# Patient Record
Sex: Female | Born: 1990 | Race: Black or African American | Hispanic: No | Marital: Single | Smoking: Former smoker
Health system: Southern US, Community
[De-identification: ages and names within clinical notes are randomized; demographics above are authoritative.]

## PROBLEM LIST (undated history)

## (undated) ENCOUNTER — Inpatient Hospital Stay (HOSPITAL_COMMUNITY): Payer: Self-pay

## (undated) DIAGNOSIS — F419 Anxiety disorder, unspecified: Secondary | ICD-10-CM

## (undated) DIAGNOSIS — F410 Panic disorder [episodic paroxysmal anxiety] without agoraphobia: Secondary | ICD-10-CM

## (undated) DIAGNOSIS — Z789 Other specified health status: Secondary | ICD-10-CM

## (undated) DIAGNOSIS — D573 Sickle-cell trait: Secondary | ICD-10-CM

## (undated) HISTORY — PX: NO PAST SURGERIES: SHX2092

---
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
## 2013-12-11 ENCOUNTER — Encounter (HOSPITAL_COMMUNITY): Payer: Self-pay | Admitting: Emergency Medicine

## 2013-12-11 ENCOUNTER — Emergency Department (HOSPITAL_COMMUNITY): Payer: Self-pay

## 2013-12-11 ENCOUNTER — Emergency Department (HOSPITAL_COMMUNITY)
Admission: EM | Admit: 2013-12-11 | Discharge: 2013-12-11 | Disposition: A | Discharge status: Home / Self Care | Payer: Self-pay | Attending: Emergency Medicine | Admitting: Emergency Medicine

## 2013-12-11 DIAGNOSIS — S6980XA Other specified injuries of unspecified wrist, hand and finger(s), initial encounter: Secondary | ICD-10-CM | POA: Insufficient documentation

## 2013-12-11 DIAGNOSIS — F17200 Nicotine dependence, unspecified, uncomplicated: Secondary | ICD-10-CM | POA: Insufficient documentation

## 2013-12-11 DIAGNOSIS — S6990XA Unspecified injury of unspecified wrist, hand and finger(s), initial encounter: Secondary | ICD-10-CM | POA: Insufficient documentation

## 2013-12-11 DIAGNOSIS — S60229A Contusion of unspecified hand, initial encounter: Secondary | ICD-10-CM | POA: Insufficient documentation

## 2013-12-11 DIAGNOSIS — S60221A Contusion of right hand, initial encounter: Secondary | ICD-10-CM

## 2013-12-11 DIAGNOSIS — Z880 Allergy status to penicillin: Secondary | ICD-10-CM | POA: Insufficient documentation

## 2013-12-11 DIAGNOSIS — IMO0002 Reserved for concepts with insufficient information to code with codable children: Secondary | ICD-10-CM | POA: Insufficient documentation

## 2013-12-11 DIAGNOSIS — Y9289 Other specified places as the place of occurrence of the external cause: Secondary | ICD-10-CM | POA: Insufficient documentation

## 2013-12-11 DIAGNOSIS — S6391XA Sprain of unspecified part of right wrist and hand, initial encounter: Secondary | ICD-10-CM

## 2013-12-11 DIAGNOSIS — S6390XA Sprain of unspecified part of unspecified wrist and hand, initial encounter: Secondary | ICD-10-CM | POA: Insufficient documentation

## 2013-12-11 DIAGNOSIS — Y9389 Activity, other specified: Secondary | ICD-10-CM | POA: Insufficient documentation

## 2013-12-11 MED ORDER — HYDROCODONE-ACETAMINOPHEN 5-325 MG PO TABS
1.0000 | ORAL_TABLET | Freq: Once | ORAL | Status: AC
  Administered 2013-12-11: 1 via ORAL
  Filled 2013-12-11: qty 1

## 2013-12-11 MED ORDER — NAPROXEN 500 MG PO TABS: 500.0000 mg | ORAL_TABLET | Freq: Two times a day (BID) | ORAL | Status: DC
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
## 2013-12-11 NOTE — Discharge Instructions (Signed)
Your x-rays do not show any broken bones in your hand. Use rest, ice, compression and elevation to reduce pain. Followup with a primary care provider.   Hand Contusion A hand contusion is a deep bruise on your hand area. Contusions are the result of an injury that caused bleeding under the skin. The contusion may turn blue, purple, or yellow. Minor injuries will give you a painless contusion, but more severe contusions may stay painful and swollen for a few weeks. CAUSES  A contusion is usually caused by a blow, trauma, or direct force to an area of the body. SYMPTOMS   Swelling and redness of the injured area.  Discoloration of the injured area.  Tenderness and soreness of the injured area.  Pain. DIAGNOSIS  The diagnosis can be made by taking a history and performing a physical exam. An X-ray, CT scan, or MRI may be needed to determine if there were any associated injuries, such as broken bones (fractures). TREATMENT  Often, the best treatment for a hand contusion is resting, elevating, icing, and applying cold compresses to the injured area. Over-the-counter medicines may also be recommended for pain control. HOME CARE INSTRUCTIONS   Put ice on the injured area.  Put ice in a plastic bag.  Place a towel between your skin and the bag.  Leave the ice on for 15-20 minutes, 03-04 times a day.  Only take over-the-counter or prescription medicines as directed by your caregiver. Your caregiver may recommend avoiding anti-inflammatory medicines (aspirin, ibuprofen, and naproxen) for 48 hours because these medicines may increase bruising.  If told, use an elastic wrap as directed. This can help reduce swelling. You may remove the wrap for sleeping, showering, and bathing. If your fingers become numb, cold, or blue, take the wrap off and reapply it more loosely.  Elevate your hand with pillows to reduce swelling.  Avoid overusing your hand if it is painful. SEEK IMMEDIATE MEDICAL CARE  IF:   You have increased redness, swelling, or pain in your hand.  Your swelling or pain is not relieved with medicines.  You have loss of feeling in your hand or are unable to move your fingers.  Your hand turns cold or blue.  You have pain when you move your fingers.  Your hand becomes warm to the touch.  Your contusion does not improve in 2 days. MAKE SURE YOU:   Understand these instructions.  Will watch your condition.  Will get help right away if you are not doing well or get worse. Document Released: 09/21/2001 Document Revised: 12/25/2011 Document Reviewed: 09/23/2011 [facility] Patient Information 2015 [place], [place]. This information is not intended to replace advice given to you by your health care provider. Make sure you discuss any questions you have with your health care provider.
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
## 2013-12-13 NOTE — ED Provider Notes (Signed)
Medical screening examination/treatment/procedure(s) were performed by non-physician practitioner and as supervising physician I was immediately available for consultation/collaboration.     Aneli Zara E Sandon Yoho, MD 12/13/13 0705 

## 2014-04-15 NOTE — L&D Delivery Note (Cosign Needed)
Renold Dononisha L Althouse is a 24 y.o. female G1 @ 37.3 days presenting for SROM at 0630 this morning. Augmented with Pitocin. Patient was complete and pushed.   Delivery Note At 10:58 PM a viable female was delivered via Vaginal, Spontaneous Delivery (Presentation: Left Occiput Anterior).  APGAR: 9, 9; weight 5 lb 5.6 oz (2427 g).   Placenta status: Intact, Spontaneous.  Cord: 3 vessels with the following complications: None.  Cord pH: Not collected  Anesthesia: Epidural  Episiotomy: None Lacerations: None Suture Repair: None Est. Blood Loss (mL): 50  Mom to postpartum.  Baby to Couplet care / Skin to Skin.  Jamiel Goncalves Z Limuel Nieblas 04/11/2015, 6:14 AM

## 2014-10-06 ENCOUNTER — Encounter (HOSPITAL_COMMUNITY): Payer: Self-pay | Admitting: *Deleted

## 2014-10-06 ENCOUNTER — Inpatient Hospital Stay (HOSPITAL_COMMUNITY)
Admission: AD | Admit: 2014-10-06 | Discharge: 2014-10-06 | Disposition: A | Discharge status: Home / Self Care | Payer: Medicaid Other | Source: Ambulatory Visit | Attending: Obstetrics & Gynecology | Admitting: Obstetrics & Gynecology

## 2014-10-06 DIAGNOSIS — O2341 Unspecified infection of urinary tract in pregnancy, first trimester: Secondary | ICD-10-CM | POA: Insufficient documentation

## 2014-10-06 DIAGNOSIS — Z87891 Personal history of nicotine dependence: Secondary | ICD-10-CM | POA: Insufficient documentation

## 2014-10-06 DIAGNOSIS — O210 Mild hyperemesis gravidarum: Secondary | ICD-10-CM | POA: Diagnosis not present

## 2014-10-06 DIAGNOSIS — O209 Hemorrhage in early pregnancy, unspecified: Secondary | ICD-10-CM | POA: Diagnosis present

## 2014-10-06 DIAGNOSIS — O219 Vomiting of pregnancy, unspecified: Secondary | ICD-10-CM

## 2014-10-06 DIAGNOSIS — Z3A12 12 weeks gestation of pregnancy: Secondary | ICD-10-CM | POA: Insufficient documentation

## 2014-10-06 HISTORY — DX: Other specified health status: Z78.9

## 2014-10-06 LAB — WET PREP, GENITAL
Clue Cells Wet Prep HPF POC: NONE SEEN
Trich, Wet Prep: NONE SEEN
Yeast Wet Prep HPF POC: NONE SEEN

## 2014-10-06 LAB — URINALYSIS, ROUTINE W REFLEX MICROSCOPIC
BILIRUBIN URINE: NEGATIVE
Glucose, UA: NEGATIVE mg/dL
HGB URINE DIPSTICK: NEGATIVE
Ketones, ur: NEGATIVE mg/dL
Nitrite: NEGATIVE
PH: 6 (ref 5.0–8.0)
Protein, ur: 30 mg/dL — AB
SPECIFIC GRAVITY, URINE: 1.03 (ref 1.005–1.030)
UROBILINOGEN UA: 0.2 mg/dL (ref 0.0–1.0)

## 2014-10-06 LAB — URINE MICROSCOPIC-ADD ON

## 2014-10-06 LAB — POCT PREGNANCY, URINE: PREG TEST UR: POSITIVE — AB

## 2014-10-06 MED ORDER — METOCLOPRAMIDE HCL 10 MG PO TABS: 10.0000 mg | ORAL_TABLET | Freq: Three times a day (TID) | ORAL | Status: DC | PRN

## 2014-10-06 MED ORDER — NITROFURANTOIN MONOHYD MACRO 100 MG PO CAPS: 100.0000 mg | ORAL_CAPSULE | Freq: Two times a day (BID) | ORAL | Status: DC

## 2014-10-06 NOTE — MAU Provider Note (Signed)
History     CSN: [id number]  Arrival date and time: 10/06/14 1156   First Provider Initiated Contact with Patient 10/06/14 1333      Chief Complaint  Patient presents with  . Vaginal Bleeding   HPI Audrey [name] 24 y.o. G1P0 @[redacted]w[redacted]d  presents to MAU complaining of vaginal bleeding and abdominal pain since yesterday.  She was stressed yesterday and believes that led to abd pain.  It is 6/10 now and was 10/10 last night.  It is a sharp, cramping pain.  Has had nausea and vomiting as well as weakness.  Vaginal bleeding started today.  No pad.  She notes it upon wiping.  It is light red.  She feels empty, weak and sore.  She went to pregnancy care center 2 weeks ago and had u/s showing fetal heart beat.  She has NOP appt with Dr. Gaynell Face on 7/13.  Denies vaginal discharge, dysuria, fever.   OB History    Gravida Para Term Preterm AB TAB SAB Ectopic Multiple Living   1               Past Medical History  Diagnosis Date  . Medical history non-contributory     Past Surgical History  Procedure Laterality Date  . No past surgeries      History reviewed. No pertinent family history.  History  Substance Use Topics  . Smoking status: Former Smoker    Types: Cigarettes    Quit date: 08/06/2014  . Smokeless tobacco: Not on file  . Alcohol Use: Yes     Comment: social    Allergies:  Allergies  Allergen Reactions  . Penicillins Rash    Prescriptions prior to admission  Medication Sig Dispense Refill Last Dose  . naproxen (NAPROSYN) 500 MG tablet Take 1 tablet (500 mg total) by mouth 2 (two) times daily. 30 tablet 0     ROS Pertinent ROS in HPI.  All other systems are negative.   Physical Exam   Blood pressure 105/54, pulse 111, temperature 98.4 F (36.9 C), temperature source Oral, resp. rate 18, height 5\' 2"  (1.575 m), weight 123 lb 3.2 oz (55.883 kg), last menstrual period 07/08/2014.  Physical Exam  Constitutional: She is oriented to person, place, and time. She  appears well-developed and well-nourished. No distress.  HENT:  Head: Normocephalic and atraumatic.  Eyes: EOM are normal.  Neck: Normal range of motion.  Cardiovascular: Normal rate.   92 bpm on exam  Respiratory: Effort normal and breath sounds normal. No respiratory distress.  GI: Soft. Bowel sounds are normal. She exhibits no distension and no mass. There is no tenderness. There is no rebound and no guarding.  Genitourinary:  Thin, tan colored discharge No active bleeding from cervix No CMT, no adnexal mass or tenderness  Musculoskeletal: Normal range of motion.  Neurological: She is alert and oriented to person, place, and time.  Skin: Skin is warm and dry.  Psychiatric: She has a normal mood and affect.    MAU Course  Procedures  MDM Fetal well being assured with fetal heart tones seen on triage.  UTI based on large leuks in u/a No other signs of infection  Assessment and Plan  A:  1. Urinary tract infection during pregnancy in first trimester   2. Nausea/vomiting in pregnancy    P: Discharge to home Increase po hydration Macrobid x 1 week Urine cx pending PNC asap Daily PNV Reglan 10mg  po prn up to tid for nausea Patient may return  to MAU as needed or if her condition were to change or worsen   Bertram Denver 10/06/2014, 1:34 PM

## 2014-10-06 NOTE — Discharge Instructions (Signed)

## 2014-10-06 NOTE — MAU Note (Addendum)
Pt reports last night she started having abd pian and started having some vaginal bleeding this morning. Vomited afes times since last night as well.

## 2014-10-07 ENCOUNTER — Inpatient Hospital Stay (HOSPITAL_COMMUNITY)
Admission: AD | Admit: 2014-10-07 | Discharge: 2014-10-07 | Disposition: A | Discharge status: Home / Self Care | Payer: Medicaid Other | Source: Ambulatory Visit | Attending: Obstetrics & Gynecology | Admitting: Obstetrics & Gynecology

## 2014-10-07 ENCOUNTER — Encounter (HOSPITAL_COMMUNITY): Payer: Self-pay | Admitting: *Deleted

## 2014-10-07 DIAGNOSIS — O209 Hemorrhage in early pregnancy, unspecified: Secondary | ICD-10-CM | POA: Insufficient documentation

## 2014-10-07 DIAGNOSIS — Z3A13 13 weeks gestation of pregnancy: Secondary | ICD-10-CM | POA: Diagnosis not present

## 2014-10-07 DIAGNOSIS — Z87891 Personal history of nicotine dependence: Secondary | ICD-10-CM | POA: Diagnosis not present

## 2014-10-07 DIAGNOSIS — O4692 Antepartum hemorrhage, unspecified, second trimester: Secondary | ICD-10-CM

## 2014-10-07 LAB — CBC
HEMATOCRIT: 32.9 % — AB (ref 36.0–46.0)
HEMOGLOBIN: 12 g/dL (ref 12.0–15.0)
MCH: 30.1 pg (ref 26.0–34.0)
MCHC: 36.5 g/dL — ABNORMAL HIGH (ref 30.0–36.0)
MCV: 82.5 fL (ref 78.0–100.0)
Platelets: 257 10*3/uL (ref 150–400)
RBC: 3.99 MIL/uL (ref 3.87–5.11)
RDW: 12.4 % (ref 11.5–15.5)
WBC: 8.3 10*3/uL (ref 4.0–10.5)

## 2014-10-07 LAB — ABO/RH: ABO/RH(D): O POS

## 2014-10-07 NOTE — Discharge Instructions (Signed)
Vaginal Bleeding During Pregnancy, Second Trimester °A small amount of bleeding (spotting) from the vagina is relatively common in pregnancy. It usually stops on its own. Various things can cause bleeding or spotting in pregnancy. Some bleeding may be related to the pregnancy, and some may not. Sometimes the bleeding is normal and is not a problem. However, bleeding can also be a sign of something serious. Be sure to tell your health care provider about any vaginal bleeding right away. °Some possible causes of vaginal bleeding during the second trimester include: °· Infection, inflammation, or growths on the cervix.   °· The placenta may be partially or completely covering the opening of the cervix inside the uterus (placenta previa). °· The placenta may have separated from the uterus (abruption of the placenta).   °· You may be having early (preterm) labor.   °· The cervix may not be strong enough to keep a baby inside the uterus (cervical insufficiency).   °· Tiny cysts may have developed in the uterus instead of pregnancy tissue (molar pregnancy).  °HOME CARE INSTRUCTIONS  °Watch your condition for any changes. The following actions may help to lessen any discomfort you are feeling: °· Follow your health care provider's instructions for limiting your activity. If your health care provider orders bed rest, you may need to stay in bed and only get up to use the bathroom. However, your health care provider may allow you to continue light activity. °· If needed, make plans for someone to help with your regular activities and responsibilities while you are on bed rest. °· Keep track of the number of pads you use each day, how often you change pads, and how soaked (saturated) they are. Write this down. °· Do not use tampons. Do not douche. °· Do not have sexual intercourse or orgasms until approved by your health care provider. °· If you pass any tissue from your vagina, save the tissue so you can show it to your  health care provider. °· Only take over-the-counter or prescription medicines as directed by your health care provider. °· Do not take aspirin because it can make you bleed. °· Do not exercise or perform any strenuous activities or heavy lifting without your health care provider's permission. °· Keep all follow-up appointments as directed by your health care provider. °SEEK MEDICAL CARE IF: °· You have any vaginal bleeding during any part of your pregnancy. °· You have cramps or labor pains. °· You have a fever, not controlled by medicine. °SEEK IMMEDIATE MEDICAL CARE IF:  °· You have severe cramps in your back or belly (abdomen). °· You have contractions. °· You have chills. °· You pass large clots or tissue from your vagina. °· Your bleeding increases. °· You feel light-headed or weak, or you have fainting episodes. °· You are leaking fluid or have a gush of fluid from your vagina. °MAKE SURE YOU: °· Understand these instructions. °· Will watch your condition. °· Will get help right away if you are not doing well or get worse. °Document Released: 01/09/2005 Document Revised: 04/06/2013 Document Reviewed: 12/07/2012 °[employer]® Patient Information ©2015 [employer]. This information is not intended to replace advice given to you by your health care provider. Make sure you discuss any questions you have with your health care provider. ° °Pelvic Rest °Pelvic rest is sometimes recommended for women when:  °· The placenta is partially or completely covering the opening of the cervix (placenta previa). °· There is bleeding between the uterine wall and the amniotic sac in the   first trimester (subchorionic hemorrhage). °· The cervix begins to open without labor starting (incompetent cervix, cervical insufficiency). °· The labor is too early (preterm labor). °HOME CARE INSTRUCTIONS °· Do not have sexual intercourse, stimulation, or an orgasm. °· Do not use tampons, douche, or put anything in the vagina. °· Do not lift  anything over 10 pounds (4.5 kg). °· Avoid strenuous activity or straining your pelvic muscles. °SEEK MEDICAL CARE IF:  °· You have any vaginal bleeding during pregnancy. Treat this as a potential emergency. °· You have cramping pain felt low in the stomach (stronger than menstrual cramps). °· You notice vaginal discharge (watery, mucus, or bloody). °· You have a low, dull backache. °· There are regular contractions or uterine tightening. °SEEK IMMEDIATE MEDICAL CARE IF: °You have vaginal bleeding and have placenta previa.  °Document Released: 07/27/2010 Document Revised: 06/24/2011 Document Reviewed: 07/27/2010 °[employer]® Patient Information ©2015 [employer]. This information is not intended to replace advice given to you by your health care provider. Make sure you discuss any questions you have with your health care provider. ° °

## 2014-10-07 NOTE — MAU Provider Note (Signed)
History     CSN: [id number]  Arrival date and time: 10/07/14 2148   None     Chief Complaint  Patient presents with  . Vaginal Bleeding   This is a 24 y.o. female at [redacted]w[redacted]d who presents with c/o vaginal bleeding after intercourse tonight.  She saw blood on tissue but it has not required a pad  Denies pain or cramping. She was seen yesterday for bleeding also. States was not told to use pelvic rest. They did get FHR last night. Denies any other somatic symtoms.      Vaginal Bleeding The patient's primary symptoms include vaginal bleeding. The patient's pertinent negatives include no genital itching, genital odor or pelvic pain. This is a recurrent problem. The current episode started today. The problem occurs intermittently. The patient is experiencing no pain. She is pregnant. Pertinent negatives include no abdominal pain, chills, dysuria, fever, nausea or vomiting. The vaginal discharge was bloody. The vaginal bleeding is spotting.   RN Note: Had IC few hours ago. Went to [facility] and saw blood on tissue several times when wiped. No abd pain. Was seen her Thurs with vag bleeding       Note from visit on 10/06/14: Vaginal bleeding started today. No pad. She notes it upon wiping. It is light red. She feels empty, weak and sore. She went to pregnancy care center 2 weeks ago and had u/s showing fetal heart beat. She has NOP appt with Dr. Gaynell Face on 7/13. (Treated for UTI and nausea.  No bleeding seen on exam)  OB History    Gravida Para Term Preterm AB TAB SAB Ectopic Multiple Living   1               Past Medical History  Diagnosis Date  . Medical history non-contributory     Past Surgical History  Procedure Laterality Date  . No past surgeries      History reviewed. No pertinent family history.  History  Substance Use Topics  . Smoking status: Former Smoker    Types: Cigarettes    Quit date: 08/06/2014  . Smokeless tobacco: Not on file  . Alcohol Use: Yes   Comment: social    Allergies:  Allergies  Allergen Reactions  . Penicillins Rash    Prescriptions prior to admission  Medication Sig Dispense Refill Last Dose  . metoCLOPramide (REGLAN) 10 MG tablet Take 1 tablet (10 mg total) by mouth 3 (three) times daily as needed for nausea. 20 tablet 0 10/07/2014 at 1500  . nitrofurantoin, macrocrystal-monohydrate, (MACROBID) 100 MG capsule Take 1 capsule (100 mg total) by mouth 2 (two) times daily. 14 capsule 0 10/07/2014 at 1000    Review of Systems  Constitutional: Negative for fever, chills and malaise/fatigue.  Gastrointestinal: Negative for nausea, vomiting and abdominal pain.  Genitourinary: Positive for vaginal bleeding. Negative for dysuria and pelvic pain.  Neurological: Negative for dizziness.  Patient states she has no other complaints.  Physical Exam   Blood pressure 120/70, pulse 81, temperature 98.1 F (36.7 C), resp. rate 18, height 5\' 2"  (1.575 m), weight 122 lb 12.8 oz (55.702 kg), last menstrual period 07/08/2014.  Physical Exam  Constitutional: She is oriented to person, place, and time. She appears well-developed and well-nourished. No distress.  HENT:  Head: Normocephalic.  Neck: Normal range of motion. Neck supple.  Cardiovascular: Normal rate and regular rhythm.   Respiratory: Effort normal. No respiratory distress.  GI: Soft. She exhibits no distension. There is no tenderness. There is  no rebound and no guarding.  Genitourinary: Vaginal discharge (small amount of blood in vault, watery, c/w semen mixed with blood) found.  Cervix long and closed   Musculoskeletal: Normal range of motion. She exhibits no edema.  Neurological: She is alert and oriented to person, place, and time.  Skin: Skin is warm and dry.  Psychiatric: She has a normal mood and affect.   Small amount of watery burgundy fluid Cervix long and closed  Fetal heart rate 160s  MAU Course  Procedures  MDM Discussed bleeding in the early second  trimester. Source could be from placenta but is more likely due to cervical irritation from intercourse. FHR audible which is reassuring. Recommend pelvic rest and observation  Assessment and Plan  A:  Vaginal bleeding in the second trimester       Probable post-coital bleeding       Reassuring fetal heart beat        P:  Discussed findings and implications as above       Recommend pelvic rest until 2 weeks with no further bleeding       Pelvic cramping precautions       Discharge home        Recommend she start prenatal care soon so they can reevaluate bleeding.   Wynelle Bourgeois 10/07/2014, 10:28 PM   Not charged yet

## 2014-10-07 NOTE — MAU Note (Signed)
Had IC few hours ago. Went to [facility] and saw blood on tissue several times when wiped. No abd pain. Was seen her Thurs with vag bleeding

## 2014-10-07 NOTE — Progress Notes (Signed)
Marie Williams CNM in earlier to discuss d/c plan. Written and verbal d/c instructions given and understanding voiced. 

## 2014-10-10 LAB — CULTURE, OB URINE: Culture: >=100000

## 2014-10-20 LAB — OB RESULTS CONSOLE HEPATITIS B SURFACE ANTIGEN: HEP B S AG: NEGATIVE

## 2014-10-20 LAB — OB RESULTS CONSOLE ANTIBODY SCREEN: Antibody Screen: NEGATIVE

## 2014-10-20 LAB — OB RESULTS CONSOLE HIV ANTIBODY (ROUTINE TESTING): HIV: NONREACTIVE

## 2014-10-20 LAB — OB RESULTS CONSOLE RPR: RPR: NONREACTIVE

## 2014-10-20 LAB — OB RESULTS CONSOLE RUBELLA ANTIBODY, IGM: RUBELLA: IMMUNE

## 2014-10-20 LAB — OB RESULTS CONSOLE GC/CHLAMYDIA
CHLAMYDIA, DNA PROBE: NEGATIVE
GC PROBE AMP, GENITAL: NEGATIVE

## 2014-10-21 ENCOUNTER — Other Ambulatory Visit (HOSPITAL_COMMUNITY): Payer: Self-pay | Admitting: Nurse Practitioner

## 2014-10-21 DIAGNOSIS — Z3A19 19 weeks gestation of pregnancy: Secondary | ICD-10-CM

## 2014-10-21 DIAGNOSIS — Z3689 Encounter for other specified antenatal screening: Secondary | ICD-10-CM

## 2014-12-06 ENCOUNTER — Ambulatory Visit (HOSPITAL_COMMUNITY)
Admission: RE | Admit: 2014-12-06 | Discharge: 2014-12-06 | Disposition: A | Discharge status: Home / Self Care | Payer: Medicaid Other | Source: Ambulatory Visit | Attending: Nurse Practitioner | Admitting: Nurse Practitioner

## 2014-12-06 DIAGNOSIS — Z36 Encounter for antenatal screening of mother: Secondary | ICD-10-CM | POA: Diagnosis present

## 2014-12-06 DIAGNOSIS — Z3689 Encounter for other specified antenatal screening: Secondary | ICD-10-CM

## 2014-12-06 DIAGNOSIS — Z3A19 19 weeks gestation of pregnancy: Secondary | ICD-10-CM | POA: Diagnosis not present

## 2014-12-12 ENCOUNTER — Other Ambulatory Visit (HOSPITAL_COMMUNITY): Payer: Self-pay | Admitting: Nurse Practitioner

## 2014-12-12 DIAGNOSIS — Z3A24 24 weeks gestation of pregnancy: Secondary | ICD-10-CM

## 2014-12-12 DIAGNOSIS — IMO0002 Reserved for concepts with insufficient information to code with codable children: Secondary | ICD-10-CM

## 2014-12-12 DIAGNOSIS — Z0489 Encounter for examination and observation for other specified reasons: Secondary | ICD-10-CM

## 2015-01-06 ENCOUNTER — Ambulatory Visit (HOSPITAL_COMMUNITY)
Admission: RE | Admit: 2015-01-06 | Discharge: 2015-01-06 | Disposition: A | Discharge status: Home / Self Care | Payer: Medicaid Other | Source: Ambulatory Visit | Attending: Nurse Practitioner | Admitting: Nurse Practitioner

## 2015-01-06 ENCOUNTER — Ambulatory Visit (HOSPITAL_COMMUNITY): Payer: Medicaid Other

## 2015-01-06 DIAGNOSIS — Z0489 Encounter for examination and observation for other specified reasons: Secondary | ICD-10-CM

## 2015-01-06 DIAGNOSIS — IMO0002 Reserved for concepts with insufficient information to code with codable children: Secondary | ICD-10-CM

## 2015-01-06 DIAGNOSIS — Z36 Encounter for antenatal screening of mother: Secondary | ICD-10-CM | POA: Diagnosis present

## 2015-01-06 DIAGNOSIS — Z3A24 24 weeks gestation of pregnancy: Secondary | ICD-10-CM

## 2015-02-04 ENCOUNTER — Encounter (HOSPITAL_COMMUNITY): Payer: Self-pay | Admitting: *Deleted

## 2015-02-04 ENCOUNTER — Inpatient Hospital Stay (HOSPITAL_COMMUNITY)
Admission: AD | Admit: 2015-02-04 | Discharge: 2015-02-04 | Disposition: A | Discharge status: Home / Self Care | Payer: Medicaid Other | Source: Ambulatory Visit | Attending: Family Medicine | Admitting: Family Medicine

## 2015-02-04 DIAGNOSIS — Z880 Allergy status to penicillin: Secondary | ICD-10-CM | POA: Insufficient documentation

## 2015-02-04 DIAGNOSIS — N939 Abnormal uterine and vaginal bleeding, unspecified: Secondary | ICD-10-CM | POA: Diagnosis present

## 2015-02-04 DIAGNOSIS — Z3A28 28 weeks gestation of pregnancy: Secondary | ICD-10-CM | POA: Insufficient documentation

## 2015-02-04 DIAGNOSIS — O4693 Antepartum hemorrhage, unspecified, third trimester: Secondary | ICD-10-CM | POA: Diagnosis not present

## 2015-02-04 DIAGNOSIS — Z87891 Personal history of nicotine dependence: Secondary | ICD-10-CM | POA: Insufficient documentation

## 2015-02-04 HISTORY — DX: Sickle-cell trait: D57.3

## 2015-02-04 LAB — WET PREP, GENITAL
CLUE CELLS WET PREP: NONE SEEN
Trich, Wet Prep: NONE SEEN
YEAST WET PREP: NONE SEEN

## 2015-02-04 LAB — URINALYSIS, ROUTINE W REFLEX MICROSCOPIC
Bilirubin Urine: NEGATIVE
Glucose, UA: NEGATIVE mg/dL
Ketones, ur: NEGATIVE mg/dL
NITRITE: NEGATIVE
PROTEIN: NEGATIVE mg/dL
Specific Gravity, Urine: 1.01 (ref 1.005–1.030)
UROBILINOGEN UA: 2 mg/dL — AB (ref 0.0–1.0)
pH: 6.5 (ref 5.0–8.0)

## 2015-02-04 LAB — URINE MICROSCOPIC-ADD ON

## 2015-02-04 NOTE — MAU Provider Note (Signed)
History   CSN: [id number]  Arrival date and time: 02/04/15 1910   First Provider Initiated Contact with Patient 02/04/15 2024      Chief Complaint  Patient presents with  . Vaginal Bleeding   HPI  Patient is a 24 yo G1P0 who presents for vaginal bleeding since this afternoon. It began about 45 minutes after vaginal intercourse with her fiance. At that time, the patient noticed bright red blood when she wiped with urination. It kept bleeding with wiping. This decreased to pinkish spotting with wiping over the course of the evening. At present, she denies bleeding. She states intercourse was more vigorous than usual today and that this was the only difference from normal activities. She denies cramping, feeling any contractions, n/v/d, and dysuria. She feels her baby moving more frequently than every 3 hours, states he moves "all the time."  She had vaginal bleeding earlier in pregnancy at about 3 months and was told to avoid rough sex. Otherwise, she has had an uncomplicated pregnancy and receives care through the health department.  Reviewing her ultrasound from 01/06/15 at [redacted]w[redacted]d, her placenta was posterior with no previa.   OB History    Gravida Para Term Preterm AB TAB SAB Ectopic Multiple Living   1               Past Medical History  Diagnosis Date  . Medical history non-contributory   . Sickle cell trait [facility])     Past Surgical History  Procedure Laterality Date  . No past surgeries      History reviewed. No pertinent family history.  Social History  Substance Use Topics  . Smoking status: Former Smoker    Types: Cigarettes    Quit date: 08/06/2014  . Smokeless tobacco: None  . Alcohol Use: No     Comment: social    Allergies:  Allergies  Allergen Reactions  . Penicillins Other (See Comments)    Reaction:  Unknown, childhood reaction     Prescriptions prior to admission  Medication Sig Dispense Refill Last Dose  . Prenatal Vit-Fe Fumarate-FA  (MULTIVITAMIN-PRENATAL) 27-0.8 MG TABS tablet Take 1 tablet by mouth daily.    02/04/2015 at Unknown time  . metoCLOPramide (REGLAN) 10 MG tablet Take 1 tablet (10 mg total) by mouth 3 (three) times daily as needed for nausea. (Patient not taking: Reported on 02/04/2015) 20 tablet 0 10/07/2014 at 1500  . nitrofurantoin, macrocrystal-monohydrate, (MACROBID) 100 MG capsule Take 1 capsule (100 mg total) by mouth 2 (two) times daily. (Patient not taking: Reported on 02/04/2015) 14 capsule 0 10/07/2014 at 1000    Review of Systems  Constitutional: Negative for fever and chills.  Cardiovascular: Negative for chest pain.  Gastrointestinal: Negative for nausea, vomiting and abdominal pain.  Genitourinary: Negative for dysuria, urgency and frequency.  Neurological: Negative for headaches.   Physical Exam   Blood pressure 118/52, pulse 88, temperature 98.1 F (36.7 C), resp. rate 18, height  (1.6 m), weight 67.677 kg (149 lb 3.2 oz), last menstrual period 07/08/2014.  Physical Exam  Constitutional: She is oriented to person, place, and time. She appears well-developed and well-nourished. No distress.  HENT:  Head: Normocephalic and atraumatic.  Cardiovascular: Normal rate, regular rhythm and normal heart sounds.  Exam reveals no gallop and no friction rub.   No murmur heard. Respiratory: Effort normal and breath sounds normal. No respiratory distress.  GI: She exhibits mass (gravid). There is no tenderness.  Musculoskeletal: Normal range of motion.  Neurological: She  is alert and oriented to person, place, and time.  Skin: Skin is warm and dry.  Psychiatric: She has a normal mood and affect. Her behavior is normal.  GU: Scant brown discharge at cervical os. Scant white discharge. No lesions observed. Cervix closed on exam. No cervical motion tenderness.   MAU Course  Procedures  MDM  FHT reactive, toco negative for contractions.  Speculum exam performed to assess for continued  bleeding.   UA, wet prep, and gc/chlamydia labs obtained to assess for infection.  Assessment and Plan  Patient presents at 5472w1d with concern for vaginal bleeding after vaginal intercourse this afternoon. FHT was reactive, patient not feeling cramping or contractions, toco negative for contractions and no BRB at cervix on speculum exam. Suspect bleeding, which has improved, was due to cervical irritation after intercourse. UA and wet prep were negative for infection. GC/chlamydia and urine culture pending. Given posterior placenta seen on prior U/S and exam negative for abdominal pain, placenta previa and abruption both very unlikely.   -Discharge patient home. -Provided preterm labor precautions and return precautions. -Advised less vigorous intercourse.  - f/u gc/chlamydia and urine culture  Jamelle HaringHillary M Fitzgerald, MD [facility], PGY-1 02/04/2015, 8:24 PM    OB FELLOW MAU DISCHARGE ATTESTATION  I have seen and examined this patient; I agree with above documentation in the resident's note.    Silvano BilisNoah B Shanay Woolman, MD 1:25 AM

## 2015-02-04 NOTE — MAU Note (Signed)
Pt. States she had intercourse this evening and experienced bright red bleeding after. Now having small amount of pink bleeding. No clots noted. Baby is moving well. Pt. States she did have bleeding earlier in pregnancy around 3 months and was told no rough sex as a precaution. Pt. States [facility]. Denies LOF or other discharge noted.

## 2015-02-04 NOTE — Discharge Instructions (Signed)
Ms. Audrey [name], your vaginal bleeding appears to be from irritation after sexual intercourse. The labs we got back were negative for a urinary tract infection and bacterial vaginosis, trichomoniasis and yeast infections. Sometimes these can present with vaginal bleeding. The fact that you are not having abdominal pain, cramping, contractions and that the amount of bleeding has decreased are all reassuring signs. If any of the remaining labs testing for gonorrhea and chlamydia come back abnormal, we will let you know. Thank you!

## 2015-02-04 NOTE — MAU Note (Addendum)
Vag bleeding after intercourse about . No pain. No clots. Still having some pink d/c but bright red initially.

## 2015-02-06 LAB — GC/CHLAMYDIA PROBE AMP (~~LOC~~) NOT AT ARMC
CHLAMYDIA, DNA PROBE: NEGATIVE
Neisseria Gonorrhea: NEGATIVE

## 2015-02-06 LAB — CULTURE, OB URINE: Special Requests: NORMAL

## 2015-04-10 ENCOUNTER — Inpatient Hospital Stay (HOSPITAL_COMMUNITY): Payer: Medicaid Other | Admitting: Anesthesiology

## 2015-04-10 ENCOUNTER — Inpatient Hospital Stay (HOSPITAL_COMMUNITY)
Admission: AD | Admit: 2015-04-10 | Discharge: 2015-04-12 | DRG: 775 | Disposition: A | Discharge status: Home / Self Care | Payer: Medicaid Other | Source: Ambulatory Visit | Attending: Obstetrics and Gynecology | Admitting: Obstetrics and Gynecology

## 2015-04-10 ENCOUNTER — Encounter (HOSPITAL_COMMUNITY): Payer: Self-pay

## 2015-04-10 DIAGNOSIS — Z3A37 37 weeks gestation of pregnancy: Secondary | ICD-10-CM

## 2015-04-10 DIAGNOSIS — Z87891 Personal history of nicotine dependence: Secondary | ICD-10-CM

## 2015-04-10 DIAGNOSIS — Z6832 Body mass index (BMI) 32.0-32.9, adult: Secondary | ICD-10-CM

## 2015-04-10 DIAGNOSIS — E669 Obesity, unspecified: Secondary | ICD-10-CM | POA: Diagnosis present

## 2015-04-10 DIAGNOSIS — O99214 Obesity complicating childbirth: Principal | ICD-10-CM | POA: Diagnosis present

## 2015-04-10 LAB — AMNISURE RUPTURE OF MEMBRANE (ROM) NOT AT ARMC: Amnisure ROM: POSITIVE

## 2015-04-10 LAB — CBC
HCT: 37.8 % (ref 36.0–46.0)
Hemoglobin: 12.8 g/dL (ref 12.0–15.0)
MCH: 29.6 pg (ref 26.0–34.0)
MCHC: 33.9 g/dL (ref 30.0–36.0)
MCV: 87.3 fL (ref 78.0–100.0)
PLATELETS: 225 10*3/uL (ref 150–400)
RBC: 4.33 MIL/uL (ref 3.87–5.11)
RDW: 13.6 % (ref 11.5–15.5)
WBC: 6.9 10*3/uL (ref 4.0–10.5)

## 2015-04-10 LAB — POCT FERN TEST: POCT Fern Test: NEGATIVE

## 2015-04-10 LAB — TYPE AND SCREEN
ABO/RH(D): O POS
Antibody Screen: NEGATIVE

## 2015-04-10 LAB — GROUP B STREP BY PCR: GROUP B STREP BY PCR: NEGATIVE

## 2015-04-10 LAB — OB RESULTS CONSOLE GBS: STREP GROUP B AG: NEGATIVE

## 2015-04-10 MED ORDER — LACTATED RINGERS IV SOLN
500.0000 mL | INTRAVENOUS | Status: DC | PRN
  Administered 2015-04-10: 1000 mL via INTRAVENOUS

## 2015-04-10 MED ORDER — LACTATED RINGERS IV SOLN
INTRAVENOUS | Status: DC
  Administered 2015-04-10 (×2): via INTRAVENOUS

## 2015-04-10 MED ORDER — ACETAMINOPHEN 325 MG PO TABS: 650.0000 mg | ORAL_TABLET | ORAL | Status: DC | PRN

## 2015-04-10 MED ORDER — LIDOCAINE HCL (PF) 1 % IJ SOLN
INTRAMUSCULAR | Status: DC | PRN
  Administered 2015-04-10 (×2): 8 mL via EPIDURAL

## 2015-04-10 MED ORDER — FENTANYL CITRATE (PF) 100 MCG/2ML IJ SOLN
50.0000 ug | INTRAMUSCULAR | Status: DC | PRN
  Administered 2015-04-10 (×2): 100 ug via INTRAVENOUS
  Filled 2015-04-10 (×2): qty 2

## 2015-04-10 MED ORDER — OXYTOCIN 40 UNITS IN LACTATED RINGERS INFUSION - SIMPLE MED
1.0000 m[IU]/min | INTRAVENOUS | Status: DC
  Administered 2015-04-10: 2 m[IU]/min via INTRAVENOUS

## 2015-04-10 MED ORDER — OXYCODONE-ACETAMINOPHEN 5-325 MG PO TABS: 2.0000 | ORAL_TABLET | ORAL | Status: DC | PRN

## 2015-04-10 MED ORDER — OXYCODONE-ACETAMINOPHEN 5-325 MG PO TABS: 1.0000 | ORAL_TABLET | ORAL | Status: DC | PRN

## 2015-04-10 MED ORDER — EPHEDRINE 5 MG/ML INJ
10.0000 mg | INTRAVENOUS | Status: DC | PRN
  Filled 2015-04-10: qty 2

## 2015-04-10 MED ORDER — HYDROXYZINE HCL 50 MG PO TABS
50.0000 mg | ORAL_TABLET | Freq: Four times a day (QID) | ORAL | Status: DC | PRN
  Filled 2015-04-10: qty 1

## 2015-04-10 MED ORDER — SODIUM CHLORIDE 0.9 % IV SOLN: 250.0000 mL | INTRAVENOUS | Status: DC | PRN

## 2015-04-10 MED ORDER — FENTANYL 2.5 MCG/ML BUPIVACAINE 1/10 % EPIDURAL INFUSION (WH - ANES)
14.0000 mL/h | INTRAMUSCULAR | Status: DC | PRN
  Administered 2015-04-10: 14 mL/h via EPIDURAL
  Filled 2015-04-10: qty 125

## 2015-04-10 MED ORDER — TERBUTALINE SULFATE 1 MG/ML IJ SOLN
0.2500 mg | Freq: Once | INTRAMUSCULAR | Status: DC | PRN
  Filled 2015-04-10: qty 1

## 2015-04-10 MED ORDER — DIPHENHYDRAMINE HCL 50 MG/ML IJ SOLN: 12.5000 mg | INTRAMUSCULAR | Status: DC | PRN

## 2015-04-10 MED ORDER — OXYTOCIN 10 UNIT/ML IJ SOLN: 10.0000 [IU] | Freq: Once | INTRAMUSCULAR | Status: DC

## 2015-04-10 MED ORDER — ONDANSETRON HCL 4 MG/2ML IJ SOLN
4.0000 mg | Freq: Four times a day (QID) | INTRAMUSCULAR | Status: DC | PRN
  Administered 2015-04-10: 4 mg via INTRAVENOUS
  Filled 2015-04-10: qty 2

## 2015-04-10 MED ORDER — OXYTOCIN 40 UNITS IN LACTATED RINGERS INFUSION - SIMPLE MED
62.5000 mL/h | INTRAVENOUS | Status: DC
  Administered 2015-04-10: 62.5 mL/h via INTRAVENOUS
  Filled 2015-04-10: qty 1000

## 2015-04-10 MED ORDER — OXYTOCIN BOLUS FROM INFUSION
500.0000 mL | INTRAVENOUS | Status: DC
  Administered 2015-04-10: 500 mL via INTRAVENOUS

## 2015-04-10 MED ORDER — SODIUM CHLORIDE 0.9 % IJ SOLN: 3.0000 mL | INTRAMUSCULAR | Status: DC | PRN

## 2015-04-10 MED ORDER — PHENYLEPHRINE 40 MCG/ML (10ML) SYRINGE FOR IV PUSH (FOR BLOOD PRESSURE SUPPORT)
80.0000 ug | PREFILLED_SYRINGE | INTRAVENOUS | Status: DC | PRN
  Filled 2015-04-10: qty 2
  Filled 2015-04-10: qty 20

## 2015-04-10 MED ORDER — LIDOCAINE HCL (PF) 1 % IJ SOLN
30.0000 mL | INTRAMUSCULAR | Status: DC | PRN
  Filled 2015-04-10: qty 30

## 2015-04-10 MED ORDER — SODIUM CHLORIDE 0.9 % IJ SOLN: 3.0000 mL | Freq: Two times a day (BID) | INTRAMUSCULAR | Status: DC

## 2015-04-10 MED ORDER — CITRIC ACID-SODIUM CITRATE 334-500 MG/5ML PO SOLN: 30.0000 mL | ORAL | Status: DC | PRN

## 2015-04-10 NOTE — Anesthesia Preprocedure Evaluation (Signed)
Anesthesia Evaluation  Patient identified by MRN, date of birth, ID band Patient awake    Reviewed: Allergy & Precautions, H&P , NPO status , Patient's Chart, lab work & pertinent test results  Airway Mallampati: I  TM Distance: >3 FB Neck ROM: full    Dental no notable dental hx.    Pulmonary neg pulmonary ROS, former smoker,    Pulmonary exam normal        Cardiovascular negative cardio ROS Normal cardiovascular exam     Neuro/Psych negative neurological ROS  negative psych ROS   GI/Hepatic negative GI ROS, Neg liver ROS,   Endo/Other  negative endocrine ROS  Renal/GU negative Renal ROS     Musculoskeletal   Abdominal (+) + obese,   Peds  Hematology negative hematology ROS (+)   Anesthesia Other Findings   Reproductive/Obstetrics (+) Pregnancy                             Anesthesia Physical Anesthesia Plan  ASA: II  Anesthesia Plan: Epidural   Post-op Pain Management:    Induction:   Airway Management Planned:   Additional Equipment:   Intra-op Plan:   Post-operative Plan:   Informed Consent: I have reviewed the patients History and Physical, chart, labs and discussed the procedure including the risks, benefits and alternatives for the proposed anesthesia with the patient or authorized representative who has indicated his/her understanding and acceptance.     Plan Discussed with:   Anesthesia Plan Comments:         Anesthesia Quick Evaluation  

## 2015-04-10 NOTE — Anesthesia Procedure Notes (Signed)
Epidural Patient location during procedure: OB Start time: 04/10/2015 7:57 PM End time: 04/10/2015 8:01 PM  Staffing Anesthesiologist: Leilani AbleHATCHETT, Kaydense Rizo Performed by: anesthesiologist   Preanesthetic Checklist Completed: patient identified, surgical consent, pre-op evaluation, timeout performed, IV checked, risks and benefits discussed and monitors and equipment checked  Epidural Patient position: sitting Prep: site prepped and draped and DuraPrep Patient monitoring: continuous pulse ox and blood pressure Approach: midline Location: L3-L4 Injection technique: LOR air  Needle:  Needle type: Tuohy  Needle gauge: 17 G Needle length: 9 cm and 9 Needle insertion depth: 6 cm Catheter type: closed end flexible Catheter size: 19 Gauge Catheter at skin depth: 11 cm Test dose: negative and Other  Assessment Sensory level: T9 Events: blood not aspirated, injection not painful, no injection resistance, negative IV test and no paresthesia  Additional Notes Reason for block:procedure for pain

## 2015-04-10 NOTE — MAU Note (Signed)
Pt c/o clear fluid leaking starting around 0620 this morning. Pt feeling some irregular contractions. Pt denies bleeding.

## 2015-04-10 NOTE — Progress Notes (Signed)
Renold Dononisha L Yepes is a 24 y.o. G1P0000 at 234w3d by ultrasound admitted for rupture of membranes  Subjective:   Objective: BP 120/69 mmHg  Pulse 69  Temp(Src) 98.4 F (36.9 C) (Oral)  Resp 18  Ht 5\' 3"  (1.6 m)  Wt 180 lb (81.647 kg)  BMI 31.89 kg/m2  LMP 07/08/2014      FHT:  FHR: 135 bpm, variability: moderate,  accelerations:  Present,  decelerations:  Absent UC:   regular, every 2-4 minutes SVE:   Dilation: 5 Effacement (%): 90 Station: -2 Exam by:: [email or web address],rnc-ob  Labs: Lab Results  Component Value Date   WBC 6.9 04/10/2015   HGB 12.8 04/10/2015   HCT 37.8 04/10/2015   MCV 87.3 04/10/2015   PLT 225 04/10/2015    Assessment / Plan: Augmentation of labor, progressing well  Labor: Progressing normally Preeclampsia:  no signs or symptoms of toxicity and intake and ouput balanced Fetal Wellbeing:  Category I Pain Control:  Labor support without medications I/D:  n/a Anticipated MOD:  NSVD  Suleika Donavan DARLENE 04/10/2015, 2:21 PM

## 2015-04-10 NOTE — Progress Notes (Signed)
Labor Progress Note Audrey [name] is a 24 y.o. G1P0000 at 3735w3d presented for SROM @ 6:20 AM  S: Patient nauseated after receiving epidural  O:  BP 124/67 mmHg  Pulse 86  Temp(Src) 97.5 F (36.4 C) (Oral)  Resp 18  Ht 5\' 3"  (1.6 m)  Wt 180 lb (81.647 kg)  BMI 31.89 kg/m2  SpO2 100%  LMP 07/08/2014 EFM: 130 bpm/ Moderate variability/ Reactive   CVE: Dilation: 8 Effacement (%): 80, 90 Cervical Position: Middle Station: 0, +1 Presentation: Vertex Exam by:: Audrey BurtonEmily Rothermel RN    A&P: 24 y.o. G1P0000 4035w3d presented for SROM @ 6:20 AM  #Labor: Continue Pitocin  #Pain: Epidural  #FWB: Category 1  #GBS negative   Aliahna Statzer Angelene GiovanniZ Kassady Laboy, MD 8:50 PM

## 2015-04-10 NOTE — H&P (Signed)
Audrey [name] is a 24 y.o. female G1 @ 37.3 days presenting for SROM at 0630 this morning. Fluid was clear. Maternal Medical History:  Reason for admission: Rupture of membranes.   Contractions: Onset was less than 1 hour ago.   Frequency: rare.   Perceived severity is mild.    Fetal activity: Perceived fetal activity is normal.   Last perceived fetal movement was within the past hour.    Prenatal complications: no prenatal complications Prenatal Complications - Diabetes: none.    OB History    Gravida Para Term Preterm AB TAB SAB Ectopic Multiple Living   1 0 0 0 0 0 0 0 0 0      Past Medical History  Diagnosis Date  . Medical history non-contributory   . Sickle cell trait (HCC)   . Sickle cell trait [facility](HCC)    Past Surgical History  Procedure Laterality Date  . No past surgeries     Family History: family history is negative for Cancer, Diabetes, and Hypertension. Social History:  reports that she quit smoking about 8 months ago. Her smoking use included Cigarettes. She has never used smokeless tobacco. She reports that she does not drink alcohol or use illicit drugs.   Prenatal Transfer Tool  Maternal Diabetes: No Genetic Screening: Normal Maternal Ultrasounds/Referrals: Normal Fetal Ultrasounds or other Referrals:  None Maternal Substance Abuse:  No Significant Maternal Medications:  None Significant Maternal Lab Results:  Lab values include: Other:  Other Comments:  unknown GBS status  Review of Systems  Constitutional: Negative.   HENT: Negative.   Eyes: Negative.   Respiratory: Negative.   Cardiovascular: Negative.   Gastrointestinal: Negative.   Genitourinary: Negative.   Musculoskeletal: Negative.   Skin: Negative.   Neurological: Negative.   Endo/Heme/Allergies: Negative.   Psychiatric/Behavioral: Negative.     Dilation: 4 Effacement (%): 60 Station: -2 Exam by:: [email or web address],rnc Blood pressure 123/85, pulse 71, temperature 98.4 F (36.9 C),  temperature source Oral, resp. rate 18, height 5\' 3"  (1.6 m), weight 180 lb (81.647 kg), last menstrual period 07/08/2014. Maternal Exam:  Uterine Assessment: Contraction strength is mild.  Contraction frequency is rare.   Abdomen: Patient reports no abdominal tenderness. Fetal presentation: vertex  Introitus: Normal vulva. Amniotic fluid character: clear.  Pelvis: adequate for delivery.   Cervix: Cervix evaluated by digital exam.     Fetal Exam Fetal Monitor Review: Mode: ultrasound.   Variability: moderate (6-25 bpm).    Fetal State Assessment: Category I - tracings are normal.     Physical Exam  Constitutional: She is oriented to person, place, and time. She appears well-developed and well-nourished.  HENT:  Head: Normocephalic.  Eyes: Pupils are equal, round, and reactive to light.  Neck: Normal range of motion.  Cardiovascular: Normal rate, regular rhythm, normal heart sounds and intact distal pulses.   Respiratory: Effort normal and breath sounds normal.  GI: Soft. Bowel sounds are normal.  Genitourinary: Vagina normal and uterus normal.  Musculoskeletal: Normal range of motion.  Neurological: She is alert and oriented to person, place, and time. She has normal reflexes.  Skin: Skin is warm and dry.  Psychiatric: She has a normal mood and affect. Her behavior is normal. Judgment and thought content normal.    Prenatal labs: ABO, Rh: --/--/O POS (06/24 2205) Antibody: Negative (07/07 0000) Rubella: Immune (07/07 0000) RPR: Nonreactive (07/07 0000)  HBsAg: Negative (07/07 0000)  HIV: Non-reactive (07/07 0000)  GBS:     Assessment/Plan: SROM at 0630, mild occasional contraction,  will admit. GBS unknown.   Wyvonnia Dusky DARLENE 04/10/2015, 11:32 AM

## 2015-04-10 NOTE — Progress Notes (Signed)
Audrey [name] is a 24 y.o. G1P0000 at 1228w3d by ultrasound admitted for rupture of membranes  Subjective:   Objective: BP 97/67 mmHg  Pulse 80  Temp(Src) 98 F (36.7 C) (Axillary)  Resp 18  Ht 5\' 3"  (1.6 m)  Wt 180 lb (81.647 kg)  BMI 31.89 kg/m2  LMP 07/08/2014      FHT:  FHR: 135 bpm, variability: moderate,  accelerations:  Present,  decelerations:  Absent UC:   regular, every 5 minutes SVE:   Dilation: 5.5 Effacement (%): 90 Station: -1, 0 Exam by:: rzhang,rnc-ob  Labs: Lab Results  Component Value Date   WBC 6.9 04/10/2015   HGB 12.8 04/10/2015   HCT 37.8 04/10/2015   MCV 87.3 04/10/2015   PLT 225 04/10/2015    Assessment / Plan: Augmentation of labor, progressing well  Labor: Progressing normally Preeclampsia:  no signs or symptoms of toxicity and intake and ouput balanced Fetal Wellbeing:  Category I Pain Control:  Fentanyl I/D:  n/a Anticipated MOD:  NSVD  Audrey [name] 04/10/2015, 6:14 PM

## 2015-04-11 ENCOUNTER — Encounter (HOSPITAL_COMMUNITY): Payer: Self-pay | Admitting: *Deleted

## 2015-04-11 LAB — CBC
HCT: 32.3 % — ABNORMAL LOW (ref 36.0–46.0)
Hemoglobin: 11 g/dL — ABNORMAL LOW (ref 12.0–15.0)
MCH: 29.3 pg (ref 26.0–34.0)
MCHC: 34.1 g/dL (ref 30.0–36.0)
MCV: 86.1 fL (ref 78.0–100.0)
PLATELETS: 188 10*3/uL (ref 150–400)
RBC: 3.75 MIL/uL — ABNORMAL LOW (ref 3.87–5.11)
RDW: 13.5 % (ref 11.5–15.5)
WBC: 9.2 10*3/uL (ref 4.0–10.5)

## 2015-04-11 LAB — RPR: RPR: NONREACTIVE

## 2015-04-11 MED ORDER — BENZOCAINE-MENTHOL 20-0.5 % EX AERO
1.0000 "application " | INHALATION_SPRAY | CUTANEOUS | Status: DC | PRN
  Administered 2015-04-11: 1 via TOPICAL
  Filled 2015-04-11: qty 56

## 2015-04-11 MED ORDER — DIBUCAINE 1 % RE OINT: 1.0000 "application " | TOPICAL_OINTMENT | RECTAL | Status: DC | PRN

## 2015-04-11 MED ORDER — OXYCODONE-ACETAMINOPHEN 5-325 MG PO TABS
1.0000 | ORAL_TABLET | ORAL | Status: DC | PRN
  Administered 2015-04-11: 1 via ORAL
  Filled 2015-04-11: qty 1

## 2015-04-11 MED ORDER — IBUPROFEN 600 MG PO TABS
600.0000 mg | ORAL_TABLET | Freq: Four times a day (QID) | ORAL | Status: DC
  Administered 2015-04-11 – 2015-04-12 (×6): 600 mg via ORAL
  Filled 2015-04-11 (×6): qty 1

## 2015-04-11 MED ORDER — DIPHENHYDRAMINE HCL 25 MG PO CAPS: 25.0000 mg | ORAL_CAPSULE | Freq: Four times a day (QID) | ORAL | Status: DC | PRN

## 2015-04-11 MED ORDER — SIMETHICONE 80 MG PO CHEW: 80.0000 mg | CHEWABLE_TABLET | ORAL | Status: DC | PRN

## 2015-04-11 MED ORDER — ZOLPIDEM TARTRATE 5 MG PO TABS: 5.0000 mg | ORAL_TABLET | Freq: Every evening | ORAL | Status: DC | PRN

## 2015-04-11 MED ORDER — ACETAMINOPHEN 325 MG PO TABS
650.0000 mg | ORAL_TABLET | ORAL | Status: DC | PRN
  Administered 2015-04-11: 650 mg via ORAL
  Filled 2015-04-11: qty 2

## 2015-04-11 MED ORDER — ONDANSETRON HCL 4 MG/2ML IJ SOLN: 4.0000 mg | INTRAMUSCULAR | Status: DC | PRN

## 2015-04-11 MED ORDER — LANOLIN HYDROUS EX OINT: TOPICAL_OINTMENT | CUTANEOUS | Status: DC | PRN

## 2015-04-11 MED ORDER — ONDANSETRON HCL 4 MG PO TABS: 4.0000 mg | ORAL_TABLET | ORAL | Status: DC | PRN

## 2015-04-11 MED ORDER — PRENATAL MULTIVITAMIN CH
1.0000 | ORAL_TABLET | Freq: Every day | ORAL | Status: DC
  Administered 2015-04-11 – 2015-04-12 (×2): 1 via ORAL
  Filled 2015-04-11 (×2): qty 1

## 2015-04-11 MED ORDER — TETANUS-DIPHTH-ACELL PERTUSSIS 5-2.5-18.5 LF-MCG/0.5 IM SUSP: 0.5000 mL | Freq: Once | INTRAMUSCULAR | Status: DC

## 2015-04-11 MED ORDER — WITCH HAZEL-GLYCERIN EX PADS: 1.0000 "application " | MEDICATED_PAD | CUTANEOUS | Status: DC | PRN

## 2015-04-11 MED ORDER — SENNOSIDES-DOCUSATE SODIUM 8.6-50 MG PO TABS
2.0000 | ORAL_TABLET | ORAL | Status: DC
  Administered 2015-04-11 (×2): 2 via ORAL
  Filled 2015-04-11 (×2): qty 2

## 2015-04-11 NOTE — Lactation Note (Signed)
This note was copied from the chart of Audrey [name]. Lactation Consultation Note New mom has short shaft nipples, everts when stimulated. Rt. Nipples everts more, Lt. Nipples compresses inwards flat. Reverse pressure done to evert nipple more, but not helpful. Breast feel heavy, hand expression taught. Rt. Breast has good flow of colostrum. Lt. Breast no colostrum came out. I feel that Lt. Breast feel like it has edema. Gave mom shells and asked after pumping to wear bra for support and apply shells to evert nipple more for a deeper latch. Fitted mom #16NS and application taught.  Mom encouraged to feed baby 8-12 times/24 hours and with feeding cues. Referred to Baby and Me Book in Breastfeeding section Pg. 22-23 for position options and Proper latch demonstration. Mom shown how to use DEBP & how to disassemble, clean, & reassemble parts. Explained the importance of post pumping. Mom knows to pump q3h for 15-20 min.Mom encouraged to do skin-to-skin. Information reviewed for LPI newborn behavior and information sheet given. Since the baby is 5.5 lbs, discussed supplementing. Mom is to BF, pump, hand express then give the baby anything mom got, then give formula according to age of baby. Encouraged strict I&O,and supply and demand. WH/LC brochure given w/resources, support groups and LC services. Baby needed a lot of stimulation to wake up and to BF. Explained since the baby is SGA he may need more stimulation and more important not to miss many feedings.  Patient Name: Audrey Rebeca Allegraonisha Reeder AVWUJ'WToday's Date: 04/11/2015 Reason for consult: Initial assessment;Late preterm infant;Difficult latch;Infant < 6lbs   Maternal Data Has patient been taught Hand Expression?: Yes Does the patient have breastfeeding experience prior to this delivery?: No  Feeding Feeding Type: Other (comment) (breast, breast milk, formula in bottle.) Nipple Type: Slow - flow Length of feed: 0 min (would not wake up enough to  suck)  LATCH Score/Interventions Latch: Repeated attempts needed to sustain latch, nipple held in mouth throughout feeding, stimulation needed to elicit sucking reflex. Intervention(s): Skin to skin;Teach feeding cues;Waking techniques Intervention(s): Adjust position;Assist with latch;Breast massage;Breast compression  Audible Swallowing: A few with stimulation Intervention(s): Hand expression Intervention(s): Hand expression;Alternate breast massage  Type of Nipple: Everted at rest and after stimulation (Lt. flatens out, Rt. everts.) Intervention(s): Reverse pressure;Shells;Hand pump;Double electric pump  Comfort (Breast/Nipple): Soft / non-tender     Hold (Positioning): Assistance needed to correctly position infant at breast and maintain latch. Intervention(s): Skin to skin;Position options;Support Pillows;Breastfeeding basics reviewed  LATCH Score: 7  Lactation Tools Discussed/Used Tools: Shells;Nipple Shields;Pump;Bottle Nipple shield size: 16;20 Shell Type: Inverted Breast pump type: Double-Electric Breast Pump WIC Program: Yes Pump Review: Setup, frequency, and cleaning;Milk Storage Initiated by:: RN Date initiated:: 04/11/15   Consult Status Consult Status: Follow-up Date: 04/12/15 Follow-up type: In-patient    Charyl DancerCARVER, Jaquille Kau G 04/11/2015, 10:19 AM

## 2015-04-11 NOTE — Anesthesia Postprocedure Evaluation (Signed)
Anesthesia Post Note  Patient: Audrey [name]  Procedure(s) Performed: * No procedures listed *  Patient location during evaluation: Mother Baby Anesthesia Type: Epidural Level of consciousness: awake, awake and alert, oriented and patient cooperative Pain management: pain level controlled Vital Signs Assessment: post-procedure vital signs reviewed and stable Respiratory status: spontaneous breathing, nonlabored ventilation and respiratory function stable Cardiovascular status: stable Postop Assessment: no headache, no backache, patient able to bend at knees and no signs of nausea or vomiting Anesthetic complications: no    Last Vitals:  Filed Vitals:   04/11/15 0230 04/11/15 0604  BP: 101/68 111/69  Pulse: 60 62  Temp: 36.7 C 36.9 C  Resp: 18 18    Last Pain:  Filed Vitals:   04/11/15 0608  PainSc: 6                  Ellean Firman L

## 2015-04-11 NOTE — Progress Notes (Signed)
Post Partum Day 1 Subjective: no complaints, up ad lib, voiding and tolerating PO  Objective: Blood pressure 111/69, pulse 62, temperature 98.4 F (36.9 C), temperature source Oral, resp. rate 18, height 5\' 3"  (1.6 m), weight 180 lb (81.647 kg), last menstrual period 07/08/2014, SpO2 100 %.  Physical Exam:  General: alert, cooperative, appears stated age and no distress Lochia: appropriate Uterine Fundus: firm Incision: dehiscence present n/a DVT Evaluation: No evidence of DVT seen on physical exam. Negative Homan's sign. No cords or calf tenderness.   Recent Labs  04/10/15 1039 04/11/15 0548  HGB 12.8 11.0*  HCT 37.8 32.3*    Assessment/Plan: Plan for discharge tomorrow   LOS: 1 day   Wyvonnia DuskyLAWSON, MARIE DARLENE 04/11/2015, 6:47 AM

## 2015-04-12 MED ORDER — IBUPROFEN 600 MG PO TABS: 600.0000 mg | ORAL_TABLET | Freq: Four times a day (QID) | ORAL | Status: AC

## 2015-04-12 NOTE — Progress Notes (Signed)
UR chart review completed.  

## 2015-04-12 NOTE — Discharge Instructions (Signed)

## 2015-04-12 NOTE — Discharge Summary (Signed)
OB Discharge Summary     Patient Name: Audrey [name] DOB: May 12, 1990 MRN: [id number]  Date of admission: 04/10/2015 Delivering MD: Berton Bon   Date of discharge: 04/12/2015  Admitting diagnosis: 37wks, fluid leaking Intrauterine pregnancy: [redacted]w[redacted]d     Secondary diagnosis:  Principal Problem:   NSVD (normal spontaneous vaginal delivery) Active Problems:   Labor and delivery, indication for care  Additional problems: none     Discharge diagnosis: Term Pregnancy Delivered                                                                                                Post partum procedures:none  Augmentation: Pitocin  Complications: None  Hospital course:  Onset of Labor With Vaginal Delivery     24 y.o. yo G1P1001 at [redacted]w[redacted]d was admitted in Active Laboron 04/10/2015. Patient had an uncomplicated labor course as follows:  Membrane Rupture Time/Date: 6:20 AM ,04/10/2015   Intrapartum Procedures: Episiotomy: None [1]                                         Lacerations:  None [1]  Patient had a delivery of a Viable infant. 04/10/2015  Information for the patient's newborn:  Audrey, [name] [[id number]]  Delivery Method: Vag-Spont   Pateint had an uncomplicated postpartum course.  She is ambulating, tolerating a regular diet, passing flatus, and urinating well. Patient is discharged home in stable condition on 04/12/2015.   Physical exam  Filed Vitals:   04/11/15 0604 04/11/15 0958 04/11/15 1804 04/12/15 0642  BP: 111/69 93/51 123/59 107/60  Pulse: 62 75 61 64  Temp: 98.4 F (36.9 C) 98.1 F (36.7 C) 98.5 F (36.9 C) 97.7 F (36.5 C)  TempSrc: Oral Oral Oral Oral  Resp: Height:      Weight:      SpO2:       General: alert and cooperative, no distress Lochia: appropriate Uterine Fundus: firm Incision: N/A DVT Evaluation: No evidence of DVT seen on physical exam. Negative Homan's sign. Labs: Lab Results  Component Value Date   WBC  9.2 04/11/2015   HGB 11.0* 04/11/2015   HCT 32.3* 04/11/2015   MCV 86.1 04/11/2015   PLT 188 04/11/2015   No flowsheet data found.  Discharge instruction: per After Visit Summary and "Baby and Me Booklet".  After visit meds:    Medication List    TAKE these medications        ibuprofen 600 MG tablet  Commonly known as:  ADVIL,MOTRIN  Take 1 tablet (600 mg total) by mouth every 6 (six) hours.      ASK your doctor about these medications        multivitamin-prenatal 27-0.8 MG Tabs tablet  Take 1 tablet by mouth daily.       Diet: routine diet  Activity: Advance as tolerated. Pelvic rest for 6 weeks.   Outpatient follow up:[facility] weeks at [facility]  Postpartum contraception: Combination OCPs  Newborn Data: Live  born female  Birth Weight: 5 lb 5.6 oz (2427 g) APGAR: 9, 9  Baby Feeding: Breast Disposition:home with mother   04/12/2015 Federico FlakeKimberly Niles Christeen Lai, MD

## 2015-04-12 NOTE — Lactation Note (Addendum)
This note was copied from the chart of Audrey [name]. Lactation Consultation Note  Patient Name: Audrey [name] UJWJX'BToday's Date: 04/12/2015 Reason for consult: Follow-up assessment;Difficult latch;Hyperbilirubinemia;Infant < 6lbs Baby under single photo therapy. Mom reports baby not sustaining latch to left breast. She has been giving bottles during the night and this am. [facility]. Assisted Mom with positioning, took baby out of bili blanket to BF. Tried without the nipple shield to latch baby but he would not suckle. Applied #16 nipple shield and with lots of stimulation baby did develop a suckling pattern on/off. Demonstrated to Mom how to pre-fill nipple shield with EBM or formula using curved tipped syringe. This stimulated baby to be more active at the breast. Baby took approx 10 ml of formula via curved tipped syringe, then set up 5 fr feeding tube at breast and baby took another 7 ml of formula. Mom has not pumped today so stressed importance to Mom of pumping every 3 hours to encourage milk production, prevent engorgement and protect milk supply. Feeding plan discussed with Mom: BF with feeding ques 8-12 times or more in 24 hours. Use #20 nipple shield to latch baby. Initially used 16 but changed to 20 for better fit.  Limit time at breast to 30 minutes so baby will not be out of the photo therapy lights for more than 30 minutes.  Can do 1 breast per feeding if Mom desires. Alternating the breast each feeding. Supplement with EBM/formula each feeding either via 5 fr feeding tube/syringe or bottle, 10-20 ml today increasing per hours of age per LPT policy guidelines. Let FOB give supplement if using bottle or help set up feeding tube system. Post pump for 15 minutes to encourage milk production and have EBM to supplement. [facility]. Encouraged to call for assist as needed.  Reviewed cleaning of tools Mom is using with  BF.   Maternal Data    Feeding Feeding Type: Formula Length of feed: 10 min  LATCH Score/Interventions Latch: Repeated attempts needed to sustain latch, nipple held in mouth throughout feeding, stimulation needed to elicit sucking reflex. Intervention(s): Adjust position;Assist with latch;Breast massage  Audible Swallowing: A few with stimulation  Type of Nipple: Everted at rest and after stimulation (short shaft bilateral, left w/dimple in center of nipple)  Comfort (Breast/Nipple): Soft / non-tender     Hold (Positioning): Assistance needed to correctly position infant at breast and maintain latch. Intervention(s): Breastfeeding basics reviewed;Support Pillows;Position options;Skin to skin  LATCH Score: 7  Lactation Tools Discussed/Used Tools: Nipple Shields;Pump;58F feeding tube / Syringe Nipple shield size: 20;16 Breast pump type: Double-Electric Breast Pump   Consult Status Consult Status: Follow-up Date: 04/13/15 Follow-up type: In-patient    Audrey [name], Audrey [name] 04/12/2015, 2:05 PM

## 2015-04-13 ENCOUNTER — Ambulatory Visit: Payer: Self-pay

## 2015-04-13 NOTE — Lactation Note (Signed)
This note was copied from the chart of Audrey [name]. Lactation Consultation Note Mom has sore nipples, has been bottle feeding d/t sore nipples. Hasn't used pump since yesterday. Mom staes letting her breast rest. Reviewed supply and demand. States has been hand expressing but really getting anything out. Comfort gels given, Rt. Nipple has a crack in center. Discussed obtaining wide flange. Has NS, personal DEBP, manual pump. Discussed engorgement and prevention. Encouraged mom to call for latch, stated she wasn't going to latch the baby for a few days until her milk comes. Giving formula in the mean time. Reviewed needs to pump and hand express to encouraged milk supply again. Stressed we need to see a latch to help her. Mom states she good and will be fine, didn't need any assistance. Has WIC and will f/u there. Patient Name: Audrey [name]: 04/13/2015 Reason for consult: Follow-up assessment   Maternal Data    Feeding    LATCH Score/Interventions       Type of Nipple: Everted at rest and after stimulation Intervention(s): Double electric pump;Hand pump  Comfort (Breast/Nipple): Filling, red/small blisters or bruises, mild/mod discomfort  Problem noted: Mild/Moderate discomfort Interventions (Mild/moderate discomfort): Comfort gels;Post-pump;Breast shields;Hand massage;Hand expression  Intervention(s): Support Pillows;Position options;Skin to skin     Lactation Tools Discussed/Used WIC Program: Yes   Consult Status Consult Status: Complete [name]: 04/13/15    Charyl DancerCARVER, Evoleht Hovatter G 04/13/2015, 1:25 PM

## 2015-06-23 ENCOUNTER — Encounter (HOSPITAL_COMMUNITY): Payer: Self-pay | Admitting: Vascular Surgery

## 2015-06-23 ENCOUNTER — Emergency Department (HOSPITAL_COMMUNITY): Payer: Medicaid Other

## 2015-06-23 ENCOUNTER — Emergency Department (HOSPITAL_COMMUNITY)
Admission: EM | Admit: 2015-06-23 | Discharge: 2015-06-23 | Disposition: A | Discharge status: Home / Self Care | Payer: Medicaid Other | Attending: Emergency Medicine | Admitting: Emergency Medicine

## 2015-06-23 DIAGNOSIS — Z862 Personal history of diseases of the blood and blood-forming organs and certain disorders involving the immune mechanism: Secondary | ICD-10-CM | POA: Diagnosis not present

## 2015-06-23 DIAGNOSIS — Z880 Allergy status to penicillin: Secondary | ICD-10-CM | POA: Diagnosis not present

## 2015-06-23 DIAGNOSIS — B349 Viral infection, unspecified: Secondary | ICD-10-CM | POA: Diagnosis not present

## 2015-06-23 DIAGNOSIS — Z87891 Personal history of nicotine dependence: Secondary | ICD-10-CM | POA: Diagnosis not present

## 2015-06-23 DIAGNOSIS — R05 Cough: Secondary | ICD-10-CM | POA: Diagnosis present

## 2015-06-23 LAB — I-STAT TROPONIN, ED: Troponin i, poc: 0 ng/mL (ref 0.00–0.08)

## 2015-06-23 LAB — BASIC METABOLIC PANEL
ANION GAP: 11 (ref 5–15)
BUN: 6 mg/dL (ref 6–20)
CALCIUM: 9.7 mg/dL (ref 8.9–10.3)
CO2: 21 mmol/L — AB (ref 22–32)
Chloride: 104 mmol/L (ref 101–111)
Creatinine, Ser: 0.84 mg/dL (ref 0.44–1.00)
GFR calc non Af Amer: >60 mL/min (ref >60)
Glucose, Bld: 90 mg/dL (ref 65–99)
POTASSIUM: 3.9 mmol/L (ref 3.5–5.1)
Sodium: 136 mmol/L (ref 135–145)

## 2015-06-23 LAB — CBC
HEMATOCRIT: 37.3 % (ref 36.0–46.0)
Hemoglobin: 12.8 g/dL (ref 12.0–15.0)
MCH: 29.4 pg (ref 26.0–34.0)
MCHC: 34.3 g/dL (ref 30.0–36.0)
MCV: 85.7 fL (ref 78.0–100.0)
PLATELETS: 204 10*3/uL (ref 150–400)
RBC: 4.35 MIL/uL (ref 3.87–5.11)
RDW: 13 % (ref 11.5–15.5)
WBC: 4 10*3/uL (ref 4.0–10.5)

## 2015-06-23 NOTE — ED Notes (Signed)
Pt reports to the ED for eval of chest congestion, lightheadedness, and dry cough. She has tried Mucinex without relief. Pt reports some SOB with minimal exertion. Pt usually gets sick whenever the pollen comes out. Pt denies any fevers, chills, or N/V/D. Pt A&Ox4, resp e/u, and skin warm and dry.

## 2015-06-23 NOTE — ED Provider Notes (Signed)
CSN: [id number]     Arrival date & time 06/23/15  1152 History   First MD Initiated Contact with Patient 06/23/15 1242     Chief Complaint  Patient presents with  . Cough   (Consider location/radiation/quality/duration/timing/severity/associated sxs/prior Treatment) HPI 25 y.o. female presents to the Emergency Department today complaining of dry cough/congestion since yesterday. Associated subjective fever. No CP/SOB/ABD pain. No N/V/D. Notes sick contacts. Has tried OTC mucinex with minimal relief. No pain currently.     Past Medical History  Diagnosis Date  . Medical history non-contributory   . Sickle cell trait (HCC)   . Sickle cell trait [facility](HCC)    Past Surgical History  Procedure Laterality Date  . No past surgeries     Family History  Problem Relation Age of Onset  . Cancer Neg Hx   . Diabetes Neg Hx   . Hypertension Neg Hx    Social History  Substance Use Topics  . Smoking status: Former Smoker    Types: Cigarettes    Quit date: 08/06/2014  . Smokeless tobacco: Never Used  . Alcohol Use: No     Comment: social   OB History    Gravida Para Term Preterm AB TAB SAB Ectopic Multiple Living   1 1 1  0 0 0 0 0 0 1     Review of Systems ROS reviewed and all are negative for acute change except as noted in the HPI.  Allergies  Penicillins  Home Medications   Prior to Admission medications   Medication Sig Start Date End Date Taking? Authorizing Provider  ibuprofen (ADVIL,MOTRIN) 600 MG tablet Take 1 tablet (600 mg total) by mouth every 6 (six) hours. 04/12/15   [name], MD   BP 128/75 mmHg  Pulse 109  Temp(Src) 98.7 F (37.1 C) (Oral)  Resp 16  SpO2 98%  LMP 05/28/2015 Physical Exam  Constitutional: She is oriented to person, place, and time. She appears well-developed and well-nourished. No distress.  HENT:  Head: Normocephalic and atraumatic.  Right Ear: Tympanic membrane, external ear and ear canal normal.  Left Ear: Tympanic membrane,  external ear and ear canal normal.  Nose: Nose normal.  Mouth/Throat: Uvula is midline, oropharynx is clear and moist and mucous membranes are normal. No trismus in the jaw. No oropharyngeal exudate, posterior oropharyngeal erythema or tonsillar abscesses.  Eyes: EOM are normal. Pupils are equal, round, and reactive to light.  Neck: Normal range of motion. Neck supple. No tracheal deviation present.  Cardiovascular: Normal rate, regular rhythm, S1 normal, S2 normal, normal heart sounds, intact distal pulses and normal pulses.   Pulmonary/Chest: Effort normal and breath sounds normal. No respiratory distress. She has no decreased breath sounds. She has no wheezes. She has no rhonchi. She has no rales.  Abdominal: Normal appearance and bowel sounds are normal. There is no tenderness.  Musculoskeletal: Normal range of motion.  Neurological: She is alert and oriented to person, place, and time.  Skin: Skin is warm and dry.  Psychiatric: She has a normal mood and affect. Her speech is normal and behavior is normal. Thought content normal.    ED Course  Procedures (including critical care time) Labs Review Labs Reviewed  BASIC METABOLIC PANEL - Abnormal; Notable for the following:    CO2 21 (*)    All other components within normal limits  CBC  I-STAT TROPOININ, ED    Imaging Review Dg Chest 2 View  06/23/2015  CLINICAL DATA:  Headache, dry cough starting yesterday  EXAM: CHEST  2 VIEW COMPARISON:  None. FINDINGS: Cardiomediastinal silhouette is unremarkable. No infiltrate or pleural effusion. No pulmonary edema. Mild perihilar bronchitic changes. IMPRESSION: No infiltrate or pulmonary edema. Mild perihilar bronchitic changes. Electronically Signed   By: Natasha Mead M.D.   On: 06/23/2015 13:05   I have personally reviewed and evaluated these images and lab results as part of my medical decision-making.   EKG Interpretation   Date/Time:  Friday June 23 2015 12:18:22 EST Ventricular Rate:   109 PR Interval:  122 QRS Duration: 76 QT Interval:  312 QTC Calculation: 420 R Axis:   75 Text Interpretation:  Sinus tachycardia Otherwise normal ECG No previous  tracing Confirmed by BEATON  MD, ROBERT ([id number]) on 06/23/2015 1:04:52 PM      MDM  I have reviewed and evaluated the relevant laboratory values.I have reviewed and evaluated the relevant imaging studies.I personally evaluated and interpreted the relevant EKG.I have reviewed the relevant previous healthcare records.I have reviewed EMS Documentation.I obtained HPI from historian.  ED Course:  Assessment: Pt is a 24yF presents with URI symptoms since yesterday . On exam, pt in NAD. VSS. Afebrile. Lungs CTA, Heart RRR. Abdomen nontender/soft. Pt CXR negative for acute infiltrate. Patients symptoms are consistent with URI, likely viral etiology. Discussed that antibiotics are not indicated for viral infections. Pt will be discharged with symptomatic treatment.  Verbalizes understanding and is agreeable with plan. Pt is hemodynamically stable & in NAD prior to dc.  Disposition/Plan:  DC Home Additional Verbal discharge instructions given and discussed with patient.  Pt Instructed to f/u with PCP Return precautions given Pt acknowledges and agrees with plan  Supervising Physician Nelva Nay, MD   Final diagnoses:  Viral syndrome        [name], PA-C 06/23/15 1334  Nelva Nay, MD 06/23/15 1354

## 2015-06-23 NOTE — ED Notes (Signed)
Pt in radiology. Radiology transport to transport pt to room upon completion of images.

## 2015-06-23 NOTE — Discharge Instructions (Signed)
Please read and follow all provided instructions.  Your diagnoses today include:  1. Viral syndrome     You appear to have an upper respiratory infection (URI). An upper respiratory tract infection, or cold, is a viral infection of the air passages leading to the lungs. It should improve gradually after 5-7 days. You may have a lingering cough that lasts for 2- 4 weeks after the infection.  Tests performed today include:  Vital signs. See below for your results today.   Medications prescribed:   Take any prescribed medications only as directed. Treatment for your infection is aimed at treating the symptoms. There are no medications, such as antibiotics, that will cure your infection.   Home care instructions:  Follow any educational materials contained in this packet.   Your illness is contagious and can be spread to others, especially during the first 3 or 4 days. It cannot be cured by antibiotics or other medicines. Take basic precautions such as washing your hands often, covering your mouth when you cough or sneeze, and avoiding public places where you could spread your illness to others.   Please continue drinking plenty of fluids.  Use over-the-counter medicines as needed as directed on packaging for symptom relief.  You may also use ibuprofen or tylenol as directed on packaging for pain or fever.  Do not take multiple medicines containing Tylenol or acetaminophen to avoid taking too much of this medication.  Follow-up instructions: Please follow-up with your primary care provider in the next 3 days for further evaluation of your symptoms if you are not feeling better.   Return instructions:   Please return to the Emergency Department if you experience worsening symptoms.   RETURN IMMEDIATELY IF you develop shortness of breath, confusion or altered mental status, a new rash, become dizzy, faint, or poorly responsive, or are unable to be cared for at home.  Please return if you  have persistent vomiting and cannot keep down fluids or develop a fever that is not controlled by tylenol or motrin.    Please return if you have any other emergent concerns.  Additional Information:  Your vital signs today were: BP 128/75 mmHg   Pulse 109   Temp(Src) 98.7 F (37.1 C) (Oral)   Resp 16   SpO2 98%   LMP 05/28/2015 If your blood pressure (BP) was elevated above 135/85 this visit, please have this repeated by your doctor within one month. --------------

## 2016-01-31 ENCOUNTER — Ambulatory Visit (HOSPITAL_COMMUNITY)
Admission: EM | Admit: 2016-01-31 | Discharge: 2016-01-31 | Disposition: A | Discharge status: Home / Self Care | Payer: Self-pay | Attending: Family Medicine | Admitting: Family Medicine

## 2016-01-31 ENCOUNTER — Encounter (HOSPITAL_COMMUNITY): Payer: Self-pay | Admitting: Emergency Medicine

## 2016-01-31 DIAGNOSIS — F419 Anxiety disorder, unspecified: Secondary | ICD-10-CM

## 2016-01-31 DIAGNOSIS — F410 Panic disorder [episodic paroxysmal anxiety] without agoraphobia: Secondary | ICD-10-CM

## 2016-01-31 DIAGNOSIS — R079 Chest pain, unspecified: Secondary | ICD-10-CM

## 2016-01-31 MED ORDER — HYDROXYZINE HCL 50 MG PO TABS: 50.0000 mg | ORAL_TABLET | Freq: Three times a day (TID) | ORAL | 0 refills | Status: DC | PRN

## 2016-01-31 MED ORDER — CITALOPRAM HYDROBROMIDE 20 MG PO TABS: 20.0000 mg | ORAL_TABLET | Freq: Every day | ORAL | 0 refills | Status: AC

## 2016-01-31 MED ORDER — CITALOPRAM HYDROBROMIDE 20 MG PO TABS: 20.0000 mg | ORAL_TABLET | Freq: Every day | ORAL | 0 refills | Status: DC

## 2016-01-31 NOTE — Discharge Instructions (Signed)
Please establish care with a  primary care doctor as soon as possible for follow up. If you chest pain and SOB gets worst despite the medicine that I give you, then you need to go to the ER.   Take Celexa 20mg  daily for anxiety. Take Hydroxyzine as needed for panic attacks.

## 2016-01-31 NOTE — ED Notes (Signed)
Handed ekg to feng zheng, np

## 2016-01-31 NOTE — ED Provider Notes (Signed)
CSN: [id number]     Arrival date & time 01/31/16  1758 History   First MD Initiated Contact with Patient 01/31/16 1908     Chief Complaint  Patient presents with  . Muscle Pain   (Consider location/radiation/quality/duration/timing/severity/associated sxs/prior Treatment) Audrey [name] is a well-appearing 25 y.o female, with sickle cell trait, presents today for chest pain and shortness of breath for 2 weeks.. She also reports that she has anxiety and panic attack of years' duration. Patient states the pain to be under the left breast and radiates up to there center chest. She describes her chest pain as a constant sore pain but would occasionally comes in waves as sharp. Patient states that she is under a lot of stress and anxiety and reports that stress makes her chest pain worst. Patient also endorses shortness of breath, worst with anxiety and panic attack moments. Patient reports her anxiety symptoms are muscle tension, irritability, and excessive worry.       Past Medical History:  Diagnosis Date  . Medical history non-contributory   . Sickle cell trait (HCC)   . Sickle cell trait [facility])    Past Surgical History:  Procedure Laterality Date  . NO PAST SURGERIES     Family History  Problem Relation Age of Onset  . Cancer Neg Hx   . Diabetes Neg Hx   . Hypertension Neg Hx    Social History  Substance Use Topics  . Smoking status: Former Smoker    Types: Cigarettes    Quit date: 08/06/2014  . Smokeless tobacco: Never Used  . Alcohol use No     Comment: social   OB History    Gravida Para Term Preterm AB Living   1 1 1  0 0 1   SAB TAB Ectopic Multiple Live Births   0 0 0 0 1     Review of Systems  Constitutional: Negative for chills, fatigue and fever.  Respiratory: Positive for shortness of breath. Negative for cough and wheezing.   Cardiovascular: Positive for chest pain. Negative for palpitations and leg swelling.  Gastrointestinal: Negative for abdominal pain,  diarrhea, nausea and vomiting.  Neurological: Negative for headaches.       +dizziness only during the panic attack    Allergies  Penicillins  Home Medications   Prior to Admission medications   Medication Sig Start Date End Date Taking? Authorizing Provider  ibuprofen (ADVIL,MOTRIN) 600 MG tablet Take 1 tablet (600 mg total) by mouth every 6 (six) hours. 04/12/15   Federico Flake, MD   Meds Ordered and Administered this Visit  Medications - No data to display  There were no vitals taken for this visit. No data found.   Physical Exam  Constitutional: She is oriented to person, place, and time. She appears well-developed and well-nourished.  HENT:  Head: Normocephalic and atraumatic.  Right Ear: External ear normal.  Left Ear: External ear normal.  Nose: Nose normal.  Mouth/Throat: Oropharynx is clear and moist. No oropharyngeal exudate.  Eyes: EOM are normal. Pupils are equal, round, and reactive to light.  Neck: Normal range of motion. Neck supple.  Cardiovascular: Normal rate, regular rhythm, normal heart sounds and intact distal pulses.  Exam reveals no gallop and no friction rub.   No murmur heard. Pulmonary/Chest: Effort normal and breath sounds normal. No respiratory distress. She has no wheezes.  Abdominal: Soft. Bowel sounds are normal. She exhibits no distension. There is no tenderness.  Musculoskeletal: Normal range of motion. She exhibits no  edema or deformity.  Lymphadenopathy:    She has no cervical adenopathy.  Neurological: She is alert and oriented to person, place, and time.  Skin: Skin is warm and dry.  Psychiatric: She has a normal mood and affect.  Nursing note and vitals reviewed.   Urgent Care Course   Clinical Course    ED EKG Date/Time: 01/31/2016 7:56 PM Performed by: Lucia EstelleZHENG, Eleanor Dimichele Authorized by: Lucia EstelleZHENG, Meerab Maselli      (including critical care time)  Labs Review Labs Reviewed - No data to display  Imaging Review No results  found.   MDM   1. Anxiety   2. Panic attack    1) EKG has inverted T-Wave on V3 lead; Old EKG from 06/2015 reviewed and also showed a inverted T-wave on the same lead. VS stable. Physical examination unremarkable.  2) I feel safe to discharge patient home, however strict ER return precaution discussed 3) Celexa 20mg  given to take daily for anxiety: patient informed to establish care with a PCP and follow up as soon as possible 4) Hydroxyzine given for panic attacks    Lucia EstelleFeng Jaison Petraglia, NP 01/31/16 2030

## 2016-01-31 NOTE — ED Triage Notes (Signed)
Points to area below right breast .  Patient reports this area is painful to palpation and painful to movement.

## 2016-03-28 ENCOUNTER — Emergency Department (HOSPITAL_COMMUNITY)
Admission: EM | Admit: 2016-03-28 | Discharge: 2016-03-28 | Disposition: A | Discharge status: Home / Self Care | Payer: Medicaid Other | Attending: Emergency Medicine | Admitting: Emergency Medicine

## 2016-03-28 ENCOUNTER — Emergency Department (HOSPITAL_COMMUNITY): Payer: Medicaid Other

## 2016-03-28 ENCOUNTER — Encounter (HOSPITAL_COMMUNITY): Payer: Self-pay | Admitting: Emergency Medicine

## 2016-03-28 DIAGNOSIS — M546 Pain in thoracic spine: Secondary | ICD-10-CM | POA: Diagnosis not present

## 2016-03-28 DIAGNOSIS — S161XXA Strain of muscle, fascia and tendon at neck level, initial encounter: Secondary | ICD-10-CM

## 2016-03-28 DIAGNOSIS — Z87891 Personal history of nicotine dependence: Secondary | ICD-10-CM | POA: Insufficient documentation

## 2016-03-28 DIAGNOSIS — Y92410 Unspecified street and highway as the place of occurrence of the external cause: Secondary | ICD-10-CM | POA: Insufficient documentation

## 2016-03-28 DIAGNOSIS — M62830 Muscle spasm of back: Secondary | ICD-10-CM | POA: Insufficient documentation

## 2016-03-28 DIAGNOSIS — Y939 Activity, unspecified: Secondary | ICD-10-CM | POA: Insufficient documentation

## 2016-03-28 DIAGNOSIS — Y999 Unspecified external cause status: Secondary | ICD-10-CM | POA: Insufficient documentation

## 2016-03-28 DIAGNOSIS — S199XXA Unspecified injury of neck, initial encounter: Secondary | ICD-10-CM | POA: Diagnosis present

## 2016-03-28 HISTORY — DX: Anxiety disorder, unspecified: F41.9

## 2016-03-28 HISTORY — DX: Panic disorder (episodic paroxysmal anxiety): F41.0

## 2016-03-28 MED ORDER — DICLOFENAC SODIUM 50 MG PO TBEC: 50.0000 mg | DELAYED_RELEASE_TABLET | Freq: Two times a day (BID) | ORAL | 0 refills | Status: AC

## 2016-03-28 MED ORDER — HYDROCODONE-ACETAMINOPHEN 5-325 MG PO TABS
1.0000 | ORAL_TABLET | Freq: Once | ORAL | Status: AC
  Administered 2016-03-28: 1 via ORAL
  Filled 2016-03-28: qty 1

## 2016-03-28 MED ORDER — CYCLOBENZAPRINE HCL 10 MG PO TABS
10.0000 mg | ORAL_TABLET | Freq: Once | ORAL | Status: AC
  Administered 2016-03-28: 10 mg via ORAL
  Filled 2016-03-28: qty 1

## 2016-03-28 MED ORDER — CYCLOBENZAPRINE HCL 10 MG PO TABS: 10.0000 mg | ORAL_TABLET | Freq: Two times a day (BID) | ORAL | 0 refills | Status: AC | PRN
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
## 2016-03-28 NOTE — ED Notes (Signed)
Patient transported to CT 

## 2016-03-28 NOTE — Discharge Instructions (Signed)
Do not take the muscle relaxant if driving as it will make you sleepy. Follow up with Dr. Eulah PontMurphy if symptoms persist.

## 2016-03-28 NOTE — ED Notes (Signed)
Pt departed in NAD.  
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
## 2020-07-06 DIAGNOSIS — O0932 Supervision of pregnancy with insufficient antenatal care, second trimester: Secondary | ICD-10-CM | POA: Insufficient documentation

## 2020-09-20 DIAGNOSIS — O119 Pre-existing hypertension with pre-eclampsia, unspecified trimester: Secondary | ICD-10-CM | POA: Insufficient documentation

## 2020-10-06 DIAGNOSIS — O1420 HELLP syndrome (HELLP), unspecified trimester: Secondary | ICD-10-CM | POA: Insufficient documentation

## 2024-04-19 ENCOUNTER — Emergency Department (HOSPITAL_COMMUNITY)
Admission: EM | Admit: 2024-04-19 | Discharge: 2024-04-19 | Disposition: A | Discharge status: Home / Self Care | Attending: Emergency Medicine | Admitting: Emergency Medicine

## 2024-04-19 ENCOUNTER — Other Ambulatory Visit: Payer: Self-pay

## 2024-04-19 ENCOUNTER — Encounter (HOSPITAL_COMMUNITY): Payer: Self-pay

## 2024-04-19 DIAGNOSIS — F410 Panic disorder [episodic paroxysmal anxiety] without agoraphobia: Secondary | ICD-10-CM | POA: Insufficient documentation

## 2024-04-19 DIAGNOSIS — R112 Nausea with vomiting, unspecified: Secondary | ICD-10-CM | POA: Diagnosis present

## 2024-04-19 DIAGNOSIS — F419 Anxiety disorder, unspecified: Secondary | ICD-10-CM | POA: Diagnosis not present

## 2024-04-19 DIAGNOSIS — R197 Diarrhea, unspecified: Secondary | ICD-10-CM | POA: Insufficient documentation

## 2024-04-19 DIAGNOSIS — N939 Abnormal uterine and vaginal bleeding, unspecified: Secondary | ICD-10-CM | POA: Insufficient documentation

## 2024-04-19 LAB — CBC WITH DIFFERENTIAL/PLATELET
Abs Immature Granulocytes: 0.03 K/uL (ref 0.00–0.07)
Basophils Absolute: 0.1 K/uL (ref 0.0–0.1)
Basophils Relative: 1 %
Eosinophils Absolute: 0.1 K/uL (ref 0.0–0.5)
Eosinophils Relative: 1 %
HCT: 40.1 % (ref 36.0–46.0)
Hemoglobin: 13.7 g/dL (ref 12.0–15.0)
Immature Granulocytes: 0 %
Lymphocytes Relative: 21 %
Lymphs Abs: 1.9 K/uL (ref 0.7–4.0)
MCH: 29.4 pg (ref 26.0–34.0)
MCHC: 34.2 g/dL (ref 30.0–36.0)
MCV: 86.1 fL (ref 80.0–100.0)
Monocytes Absolute: 0.4 K/uL (ref 0.1–1.0)
Monocytes Relative: 5 %
Neutro Abs: 6.7 K/uL (ref 1.7–7.7)
Neutrophils Relative %: 72 %
Platelets: 274 K/uL (ref 150–400)
RBC: 4.66 MIL/uL (ref 3.87–5.11)
RDW: 12.1 % (ref 11.5–15.5)
WBC: 9.2 K/uL (ref 4.0–10.5)
nRBC: 0 % (ref 0.0–0.2)

## 2024-04-19 LAB — POC URINE PREG, ED: Preg Test, Ur: NEGATIVE

## 2024-04-19 LAB — COMPREHENSIVE METABOLIC PANEL WITH GFR
ALT: 8 U/L (ref 0–44)
AST: 16 U/L (ref 15–41)
Albumin: 4.2 g/dL (ref 3.5–5.0)
Alkaline Phosphatase: 87 U/L (ref 38–126)
Anion gap: 10 (ref 5–15)
BUN: 8 mg/dL (ref 6–20)
CO2: 26 mmol/L (ref 22–32)
Calcium: 9 mg/dL (ref 8.9–10.3)
Chloride: 102 mmol/L (ref 98–111)
Creatinine, Ser: 0.79 mg/dL (ref 0.44–1.00)
GFR, Estimated: >60 mL/min
Glucose, Bld: 92 mg/dL (ref 70–99)
Potassium: 3.8 mmol/L (ref 3.5–5.1)
Sodium: 137 mmol/L (ref 135–145)
Total Bilirubin: 0.9 mg/dL (ref 0.0–1.2)
Total Protein: 7.2 g/dL (ref 6.5–8.1)

## 2024-04-19 MED ORDER — ONDANSETRON HCL 4 MG/2ML IJ SOLN
4.0000 mg | Freq: Once | INTRAMUSCULAR | Status: AC
  Administered 2024-04-19: 4 mg via INTRAVENOUS
  Filled 2024-04-19: qty 2

## 2024-04-19 MED ORDER — ONDANSETRON 4 MG PO TBDP: 4.0000 mg | ORAL_TABLET | Freq: Three times a day (TID) | ORAL | 0 refills | Status: AC | PRN

## 2024-04-19 MED ORDER — SODIUM CHLORIDE 0.9 % IV BOLUS
1000.0000 mL | Freq: Once | INTRAVENOUS | Status: AC
  Administered 2024-04-19: 1000 mL via INTRAVENOUS

## 2024-04-19 MED ORDER — HYDROXYZINE HCL 25 MG PO TABS: 25.0000 mg | ORAL_TABLET | Freq: Four times a day (QID) | ORAL | 0 refills | Status: AC | PRN

## 2024-04-19 NOTE — Discharge Instructions (Addendum)
 Please make an appointment with one of the mental health services to work with your anxiety.  Please follow-up with the gynecology office to evaluate your abnormal bleeding.  It is not currently a problem, but if you need medication for diarrhea, take loperamide (Imodium A-D).

## 2024-04-19 NOTE — ED Provider Notes (Signed)
 " [name] Provider Note   CSN: [id number] Arrival date & time: 04/19/24  9980     Patient presents with: Emesis   Audrey [name] is a 34 y.o. female.   The history is provided by the patient.  Emesis  She has history of sickle cell trait and comes in with several complaints.  He has had nausea and vomiting today.  She estimates 4 episodes of emesis and 1 episode of diarrhea.  She is still having nausea.  She denies any abdominal pain, fever, chills.  She also complains of vaginal bleeding for the last 3 weeks.  Last normal menses was in November.  She states she initially was changing pads every 4 hours and passing clots, but bleeding has subsided greatly and she is now only using tampons.  She is not using any contraception.  Third complaint is anxiety.  She states that she was on medication for this in the past but is not on it currently.  Anxiety has not been severe enough to interfere with her daily function.  She denies hallucinations, homicidal ideation, suicidal ideation.    Prior to Admission medications  Medication Sig Start Date End Date Taking? Authorizing Provider  ibuprofen  (ADVIL ) 800 MG tablet Take 800 mg by mouth. 09/24/20  Yes [provider]  citalopram  (CELEXA ) 20 MG tablet Take 1 tablet (20 mg total) by mouth daily. 01/31/16 03/01/16  Zheng, Feng, NP  cyclobenzaprine  (FLEXERIL ) 10 MG tablet Take 1 tablet (10 mg total) by mouth 2 (two) times daily as needed for muscle spasms. 03/28/16   Jamelle Lorrayne HERO, NP  diclofenac  (VOLTAREN ) 50 MG EC tablet Take 1 tablet (50 mg total) by mouth 2 (two) times daily. 03/28/16   Jamelle Lorrayne HERO, NP  hydrOXYzine  (ATARAX /VISTARIL ) 50 MG tablet Take 1 tablet (50 mg total) by mouth 3 (three) times daily as needed. 01/31/16   Zheng, Feng, NP  ibuprofen  (ADVIL ,MOTRIN ) 600 MG tablet Take 1 tablet (600 mg total) by mouth every 6 (six) hours. 04/12/15   Eldonna Suzen Octave, MD     Allergies: Penicillins    Review of Systems  Gastrointestinal:  Positive for vomiting.  All other systems reviewed and are negative.   Updated Vital Signs BP 122/83   Pulse 83   Temp 98.2 F (36.8 C) (Oral)   Resp 18   Ht 5' 3 (1.6 m)   Wt 81.6 kg   LMP 03/22/2024   SpO2 99%   BMI 31.87 kg/m   Physical Exam Vitals and nursing note reviewed.   34 year old female, resting comfortably and in no acute distress. Vital signs are normal. Oxygen saturation is 99%, which is normal. Head is normocephalic and atraumatic. PERRLA, EOMI.  Lungs are clear without rales, wheezes, or rhonchi. Heart has regular rate and rhythm without murmur. Abdomen is soft, flat, nontender. Skin is warm and dry without rash. Neurologic: Mental status is normal, cranial nerves are intact, moves all extremities equally.  (all labs ordered are listed, but only abnormal results are displayed) Labs Reviewed - No data to display  EKG: None  Radiology: No results found.   Procedures   Medications Ordered in the ED - No data to display                                  Medical Decision Making Amount and/or Complexity of Data Reviewed Labs: ordered.  Risk Prescription drug management.   Nausea, vomiting, diarrhea and pattern most suggestive of viral gastroenteritis.  Doubt serious pathology such as bowel obstruction.  No vaginal bleeding, need to rule out pregnancy.  In light of decreasing amount of bleeding and simultaneous complaints of nausea, I do not feel treatment with progesterone would be appropriate at this point.  I have ordered a pregnancy test, but she will need gynecology referral.  Anxiety which I do not feel needs emergent psychiatric evaluation.  I have reviewed her past records and notes she had been on citalopram  in the past.  I will give her outpatient mental health resources and prescribe hydroxyzine  as needed.  I have reviewed her laboratory tests, my interpretation is not  pregnant, normal CBC, normal comprehensive metabolic panel.  She feels much better after above-noted treatment.  I am discharging her with prescriptions for hydroxyzine  and ondansetron  oral dissolving tablet, advising her to use over-the-counter loperamide if she needs something for diarrhea.  I am referring her to gynecology office for further evaluation of her abnormal vaginal bleeding.     Final diagnoses:  Nausea vomiting and diarrhea  Abnormal vaginal bleeding  Anxiety    ED Discharge Orders          Ordered    ondansetron  (ZOFRAN -ODT) 4 MG disintegrating tablet  Every 8 hours PRN        04/19/24 0319    hydrOXYzine  (ATARAX ) 25 MG tablet  Every 6 hours PRN        04/19/24 0319               Raford Lenis, MD 04/19/24 0430  "

## 2024-04-19 NOTE — ED Triage Notes (Signed)
 Pt c/o emesis that started today. She also c/o vaginal bleeding that has been ongoing for weeks, lightheaded, and states she feels stressed.
